# Patient Record
Sex: Female | Born: 1990 | Hispanic: Yes | Smoking: Never smoker
Health system: Southern US, Community
[De-identification: ages and names within clinical notes are randomized; demographics above are authoritative.]

## PROBLEM LIST (undated history)

## (undated) DIAGNOSIS — O039 Complete or unspecified spontaneous abortion without complication: Secondary | ICD-10-CM

## (undated) DIAGNOSIS — L309 Dermatitis, unspecified: Secondary | ICD-10-CM

## (undated) HISTORY — DX: Dermatitis, unspecified: L30.9

## (undated) HISTORY — DX: Complete or unspecified spontaneous abortion without complication: O03.9

## (undated) HISTORY — PX: WISDOM TOOTH EXTRACTION: SHX21

---

## 2012-05-21 ENCOUNTER — Encounter: Payer: Self-pay | Admitting: Family Medicine

## 2012-05-21 ENCOUNTER — Ambulatory Visit (INDEPENDENT_AMBULATORY_CARE_PROVIDER_SITE_OTHER): Payer: Self-pay | Admitting: Family Medicine

## 2012-05-21 VITALS — BP 117/75 | HR 80 | Temp 98.2°F | Wt 145.2 lb

## 2012-05-21 DIAGNOSIS — L309 Dermatitis, unspecified: Secondary | ICD-10-CM | POA: Insufficient documentation

## 2012-05-21 DIAGNOSIS — L259 Unspecified contact dermatitis, unspecified cause: Secondary | ICD-10-CM

## 2012-05-21 MED ORDER — TRIAMCINOLONE ACETONIDE 0.05 % EX OINT: TOPICAL_OINTMENT | CUTANEOUS | Status: DC

## 2012-05-21 MED ORDER — PREDNISONE 20 MG PO TABS: 40.0000 mg | ORAL_TABLET | Freq: Every day | ORAL | Status: DC

## 2012-05-21 NOTE — Progress Notes (Signed)
Patient ID: Meredith Hall, female   DOB: 1990-12-24, 22 y.o.   MRN: [id number] SUBJECTIVE: HPI: Rash Patient presents for evaluation of a rash involving the ear, face, forearm and head. Rash started 1 week ago. Lesions are pink, and raised in texture. Rash has not changed over time. Rash is pruritic. Associated symptoms: none. Patient denies: abdominal pain, arthralgia, congestion, cough, crankiness, decrease in appetite, decrease in energy level, fever, headache, irritability, myalgia, nausea, sore throat and vomiting. Patient has not had contacts with similar rash. Patient has not had new exposures (soaps, lotions, laundry detergents, foods, medications, plants, insects or animals). Patient has had eczema for years. Comes and goes once a year flare up . No triggers.   PMH/PSH: reviewed/updated in Epic  SH/FH: reviewed/updated in Epic  Allergies: reviewed/updated in Epic  Medications: reviewed/updated in Epic  Immunizations: reviewed/updated in Epic  ROS: As above in the HPI. All other systems are stable or negative.  OBJECTIVE: APPEARANCE:  Patient in no acute distress.The patient appeared well nourished and normally developed. Acyanotic. Waist: VITAL SIGNS:BP 117/75  Pulse 80  Temp(Src) 98.2 F (36.8 C) (Oral)  Wt 145 lb 3.2 oz (65.862 kg)  LMP 05/21/2012   SKIN: warm and  Dry without tattoos and scars.  Rash is red raised in various shapes, plaque like behind ears on upper eyelids and eye brows, neck, antecubital fossa.and forearm.  HEAD and Neck: without JVD, Head and scalp: normal Eyes:No scleral icterus. Fundi normal, eye movements normal. Ears: Auricle normal, canal normal, Tympanic membranes normal, insufflation normal. Nose: normal Throat: normal Neck & thyroid: normal  CHEST & LUNGS: Chest wall: normal Lungs: Clear  CVS: Reveals the PMI to be normally located. Regular rhythm, First and Second Heart sounds are normal,  absence of murmurs, rubs or  gallops.  MUSCULOSKELETAL:  Spine: normal Joints: intact  NEUROLOGIC: oriented to time,place and person; nonfocal.  ASSESSMENT: Eczema - Plan: predniSONE (DELTASONE) 20 MG tablet, TRIAMCINOLONE ACETONIDE, TOP, 0.05 % OINT   PLAN: Skin care. Avoid contact with Rx steroids on the face and eyes.. May use limited OTC HC for a few days. Use Johnsons baby shampoo to scalp, avoid the scented shampoos. RTC prn.  Sherae Santino P. [place] Charon, M.D.

## 2012-05-22 ENCOUNTER — Other Ambulatory Visit: Payer: Self-pay | Admitting: *Deleted

## 2012-05-22 MED ORDER — TRIAMCINOLONE ACETONIDE 0.1 % EX OINT: 1.0000 "application " | TOPICAL_OINTMENT | Freq: Two times a day (BID) | CUTANEOUS | Status: DC

## 2012-08-21 ENCOUNTER — Encounter: Payer: Self-pay | Admitting: General Practice

## 2012-08-21 ENCOUNTER — Ambulatory Visit (INDEPENDENT_AMBULATORY_CARE_PROVIDER_SITE_OTHER): Payer: Self-pay | Admitting: General Practice

## 2012-08-21 VITALS — BP 129/70 | HR 81 | Temp 98.3°F | Ht 63.0 in | Wt 142.0 lb

## 2012-08-21 DIAGNOSIS — H01139 Eczematous dermatitis of unspecified eye, unspecified eyelid: Secondary | ICD-10-CM

## 2012-08-21 MED ORDER — DESONIDE 0.05 % EX CREA: TOPICAL_CREAM | Freq: Two times a day (BID) | CUTANEOUS | Status: DC

## 2012-08-21 NOTE — Progress Notes (Signed)
  Subjective:    Patient ID: Meredith Hall, female    DOB: 06-22-1990, 22 y.o.   MRN: [id number]  Rash This is a new problem. The current episode started in the past 7 days. The problem is unchanged. The affected locations include the lips and face (bilateral eye lids and upper lip). The rash is characterized by burning and dryness. She was exposed to nothing. Pertinent negatives include no anorexia, congestion, eye pain, facial edema, fatigue, fever, nail changes, rhinorrhea, shortness of breath or sore throat. Past treatments include topical steroids (used cortisone cream OTC for two days, then discontinued). The treatment provided no relief. There is no history of allergies or asthma.  She denies use of eye make up or lip stick or changes in skin care products. Denies known contact with allergens.     Review of Systems  Constitutional: Negative for fever, chills and fatigue.  HENT: Negative for congestion, sore throat, rhinorrhea and sinus pressure.   Eyes: Negative for pain.  Respiratory: Negative for chest tightness and shortness of breath.   Cardiovascular: Negative for chest pain and palpitations.  Gastrointestinal: Negative for anorexia.  Skin: Positive for rash. Negative for nail changes.       Dry rash to bilateral upper eyelids and upper lip  Neurological: Negative for dizziness, weakness and headaches.       Objective:   Physical Exam  Constitutional: She is oriented to person, place, and time. She appears well-developed and well-nourished.  Cardiovascular: Normal rate, regular rhythm and normal heart sounds.   Pulmonary/Chest: Effort normal and breath sounds normal.  Neurological: She is alert and oriented to person, place, and time.  Skin: Skin is warm and dry. Rash noted.  Dry, skin colored rash to entire bilateral upper eye lids and partial inner lower lips. Also, same rash noted to upper out lip bilaterally  Psychiatric: She has a normal mood and affect.          Assessment & Plan:  1. Eczematous dermatitis of eyelid, unspecified laterality - desonide (DESOWEN) 0.05 % cream; Apply topically 2 (two) times daily. Use on affected areas for 10 days  Dispense: 30 g; Refill: 0 -discussed proper use of medicaiton -discussed possible side effects -RTO if symptoms worsen or unresolved, will refer to dermatology -discussed proper hydration of skin -Patient verbalized understanding -Coralie Keens, FNP-C

## 2012-08-21 NOTE — Patient Instructions (Signed)

## 2012-10-15 ENCOUNTER — Ambulatory Visit (INDEPENDENT_AMBULATORY_CARE_PROVIDER_SITE_OTHER): Payer: Self-pay | Admitting: Family Medicine

## 2012-10-15 ENCOUNTER — Encounter: Payer: Self-pay | Admitting: Family Medicine

## 2012-10-15 ENCOUNTER — Ambulatory Visit (INDEPENDENT_AMBULATORY_CARE_PROVIDER_SITE_OTHER): Payer: Self-pay

## 2012-10-15 VITALS — BP 110/72 | HR 82 | Temp 97.6°F | Wt 140.0 lb

## 2012-10-15 DIAGNOSIS — M25561 Pain in right knee: Secondary | ICD-10-CM

## 2012-10-15 DIAGNOSIS — M25569 Pain in unspecified knee: Secondary | ICD-10-CM

## 2012-10-15 NOTE — Progress Notes (Signed)
Patient ID: Meredith Hall, female   DOB: [birth year], 22 y.o.   MRN: [id number] SUBJECTIVE: CC: Chief Complaint  Patient presents with  . Acute Visit    rt lower leg raised area no injury tender to touch     HPI: Small bump several weeks  Right midleg. A little sore if pressed. No recollection of injury. No fever. Feels fine otherwise  No past medical history on file. No past surgical history on file. History   Social History  . Marital Status: Single    Spouse Name: N/A    Number of Children: N/A  . Years of Education: N/A   Occupational History  . Not on file.   Social History Main Topics  . Smoking status: Never Smoker   . Smokeless tobacco: Not on file  . Alcohol Use: Not on file  . Drug Use: Not on file  . Sexual Activity: Not on file   Other Topics Concern  . Not on file   Social History Narrative  . No narrative on file   Family History  Problem Relation Age of Onset  . Thyroid disease Mother    Current Outpatient Prescriptions on File Prior to Visit  Medication Sig Dispense Refill  . desonide (DESOWEN) 0.05 % cream Apply topically 2 (two) times daily. Use on affected areas for 10 days  30 g  0  . triamcinolone ointment (KENALOG) 0.1 % Apply 1 application topically 2 (two) times daily.  80 g  1   No current facility-administered medications on file prior to visit.   No Known Allergies  There is no immunization history on file for this patient. Prior to Admission medications   Medication Sig Start Date End Date Taking? Authorizing Provider  desonide (DESOWEN) 0.05 % cream Apply topically 2 (two) times daily. Use on affected areas for 10 days 08/21/12  Yes Coralie Keens, FNP  triamcinolone ointment (KENALOG) 0.1 % Apply 1 application topically 2 (two) times daily. 05/22/12  Yes Mary-Margaret Daphine Deutscher, FNP      ROS: As above in the HPI. All other systems are stable or negative.  OBJECTIVE: APPEARANCE:  Patient in no acute distress.The patient  appeared well nourished and normally developed. Acyanotic. Waist: VITAL SIGNS:BP 110/72  Pulse 82  Temp(Src) 97.6 F (36.4 C) (Oral)  Wt 140 lb (63.504 kg)  BMI 24.81 kg/m2  LMP 10/15/2012   SKIN: warm and  Dry without overt rashes, tattoos and scars  EXTREMETIES: nonedematous.  MUSCULOSKELETAL:  Spine: normal Joints: intact Right tibia, mid-leg: small 6mm lump subcutaneous. Smooth. Mildly tender    NEUROLOGIC: oriented to time,place and person; nonfocal. Strength is normal Sensory is normal Reflexes are normal Cranial Nerves are normal.  ASSESSMENT: Pain in joint, lower leg, right - Plan: DG Tibia/Fibula Right   PLAN: Orders Placed This Encounter  Procedures  . DG Tibia/Fibula Right    Standing Status: Future     Number of Occurrences: 1     Standing Expiration Date: 12/15/2013    Order Specific Question:  Reason for Exam (SYMPTOM  OR DIAGNOSIS REQUIRED)    Answer:  pain    Order Specific Question:  Is the patient pregnant?    Answer:  No    Order Specific Question:  Preferred imaging location?    Answer:  Internal    [facility] reading (PRIMARY) by  Dr.Gwenn Teodoro: no acute findings.  Observe.  If increase in size will consider U/S. RTC prn  Pepper Kerrick P. [place] Charon, M.D.

## 2013-01-07 ENCOUNTER — Encounter: Payer: Self-pay | Admitting: Family Medicine

## 2013-01-07 ENCOUNTER — Ambulatory Visit (INDEPENDENT_AMBULATORY_CARE_PROVIDER_SITE_OTHER): Payer: Self-pay | Admitting: Family Medicine

## 2013-01-07 VITALS — BP 114/77 | HR 84 | Temp 98.8°F | Ht 64.0 in | Wt 141.0 lb

## 2013-01-07 DIAGNOSIS — J029 Acute pharyngitis, unspecified: Secondary | ICD-10-CM

## 2013-01-07 DIAGNOSIS — R05 Cough: Secondary | ICD-10-CM

## 2013-01-07 DIAGNOSIS — R059 Cough, unspecified: Secondary | ICD-10-CM

## 2013-01-07 LAB — POCT RAPID STREP A (OFFICE): Rapid Strep A Screen: NEGATIVE

## 2013-01-07 MED ORDER — GUAIFENESIN-CODEINE 100-10 MG/5ML PO SYRP: 5.0000 mL | ORAL_SOLUTION | Freq: Three times a day (TID) | ORAL | Status: DC | PRN

## 2013-01-07 NOTE — Patient Instructions (Signed)
Faringitis Viral  (Viral Pharyngitis)   La faringitis virales una infección viral que produce enrojecimiento, dolor e hinchazón (inflamación) en la garganta. No se disemina de una persona a otra (no es contagiosa).   CAUSAS  La causa es la inhalación de una gran cantidad de gérmenes llamados virus. Muchos virus diferentes pueden causar faringitis viral.  SÍNTOMAS  Los síntomas de faringitis viral son:  · Dolor de garganta  · Cansancio.  · Nariz tapada.  · Fiebre no muy elevada  · Congestión  · Tos  TRATAMIENTO  El tratamiento incluye reposo, beber muchos líquidos y el uso de medicamentos de venta libre (autorizados por el médico)  INSTRUCCIONES PARA EL CUIDADO EN EL HOGAR   · Debe ingerir gran cantidad de líquido para mantener la orina de tono claro o color amarillo pálido.  · Consuma alimentos blandos, fríos, como helados de crema, de agua o gelatina.  · Puede hacer gárgaras con agua tibia con sal (una cucharadita en 1 litro de agua).  · Después de los 7 años, pueden administrarse pastillas para la tos con seguridad.  · Solo tome medicamentos que se pueden comprar sin receta o recetados para el dolor, malestar o fiebre, como le indica el médico. No tome aspirina  Para no contagiar evite:  · El contacto boca a boca con otras personas.  · Compartir utensilios para comer o beber.  · Toser cerca de otras personas  SOLICITE ATENCIÓN MÉDICA SI:   · Mejora luego de algunos días pero luego empeora.  · Tiene fiebre o siente un dolor intenso que no puede ser controlado con los medicamentos.  · Hay otros cambios que lo preocupan.  Document Released: 10/12/2004 Document Revised: 03/27/2011  [employer]® Patient Information ©2014 [employer].

## 2013-01-07 NOTE — Progress Notes (Signed)
.Patient ID: Meredith Hall, female   DOB: 18-Sep-1990, 22 y.o.   MRN: [id number] SUBJECTIVE: CC: Chief Complaint  Patient presents with  . Acute Visit    sore throat cough    NWG:NFAO Throat Patient complains of sore throat. Associated symptoms include coryza, dry cough and sore throat. Onset of symptoms was 3 days ago, and have been gradually worsening since that time. She is drinking plenty of fluids. She has not had recent close exposure to someone with proven streptococcal pharyngitis. Lingering cough  No past medical history on file. No past surgical history on file. History   Social History  . Marital Status: Single    Spouse Name: N/A    Number of Children: N/A  . Years of Education: N/A   Occupational History  . Not on file.   Social History Main Topics  . Smoking status: Never Smoker   . Smokeless tobacco: Not on file  . Alcohol Use: Not on file  . Drug Use: Not on file  . Sexual Activity: Not on file   Other Topics Concern  . Not on file   Social History Narrative  . No narrative on file   Family History  Problem Relation Age of Onset  . Thyroid disease Mother    Current Outpatient Prescriptions on File Prior to Visit  Medication Sig Dispense Refill  . desonide (DESOWEN) 0.05 % cream Apply topically 2 (two) times daily. Use on affected areas for 10 days  30 g  0  . triamcinolone ointment (KENALOG) 0.1 % Apply 1 application topically 2 (two) times daily.  80 g  1   No current facility-administered medications on file prior to visit.   No Known Allergies  There is no immunization history on file for this patient. Prior to Admission medications   Medication Sig Start Date End Date Taking? Authorizing Provider  desonide (DESOWEN) 0.05 % cream Apply topically 2 (two) times daily. Use on affected areas for 10 days 08/21/12   Coralie Keens, FNP  triamcinolone ointment (KENALOG) 0.1 % Apply 1 application topically 2 (two) times daily. 05/22/12   Mary-Margaret  Daphine Deutscher, FNP     ROS: As above in the HPI. All other systems are stable or negative.  OBJECTIVE: APPEARANCE:  Patient in no acute distress.The patient appeared well nourished and normally developed. Acyanotic. Waist: VITAL SIGNS:BP 114/77  Pulse 84  Temp(Src) 98.8 F (37.1 C) (Oral)  Ht 5\' 4"  (1.626 m)  Wt 141 lb (63.957 kg)  BMI 24.19 kg/m2  hispanic F  SKIN: warm and  Dry without overt rashes, tattoos and scars  HEAD and Neck: without JVD, Head and scalp: normal Eyes:No scleral icterus. Fundi normal, eye movements normal. Ears: Auricle normal, canal normal, Tympanic membranes normal, insufflation normal. Nose: nasal congestion Throat: tonsillar pillars are red Neck & thyroid: normal  CHEST & LUNGS: Chest wall: normal Lungs: Clear  CVS: Reveals the PMI to be normally located. Regular rhythm, First and Second Heart sounds are normal,  absence of murmurs, rubs or gallops. Peripheral vasculature: Radial pulses: normal Dorsal pedis pulses: normal Posterior pulses: normal  ABDOMEN:  Appearance: normal Benign, no organomegaly, no masses, no Abdominal Aortic enlargement. No Guarding , no rebound. No Bruits. Bowel sounds: normal  RECTAL: N/A GU: N/A  EXTREMETIES: nonedematous.  MUSCULOSKELETAL:  Spine: normal Joints: intact  NEUROLOGIC: oriented to time,place and person; nonfocal. Strength is normal Sensory is normal Reflexes are normal Cranial Nerves are normal.  ASSESSMENT: Sore throat - Plan: Rapid Strep A,  guaiFENesin-codeine (ROBITUSSIN AC) 100-10 MG/5ML syrup  Cough - Plan: guaiFENesin-codeine (ROBITUSSIN AC) 100-10 MG/5ML syrup  PLAN:  Orders Placed This Encounter  Procedures  . Rapid Strep A   Meds ordered this encounter  Medications  . guaiFENesin-codeine (ROBITUSSIN AC) 100-10 MG/5ML syrup    Sig: Take 5 mLs by mouth 3 (three) times daily as needed for cough.    Dispense:  120 mL    Refill:  0  saline gargles and fluids.  There are  no discontinued medications. Return if symptoms worsen or fail to improve.  Navid Lenzen P. [place] Charon, M.D.

## 2013-01-14 MED ORDER — HYDROCODONE-HOMATROPINE 5-1.5 MG/5ML PO SYRP: 5.0000 mL | ORAL_SOLUTION | Freq: Three times a day (TID) | ORAL | Status: DC | PRN

## 2013-01-14 NOTE — Addendum Note (Signed)
Addended by: Ileana Ladd on: 01/14/2013 02:08 PM   Modules accepted: Orders

## 2013-01-14 NOTE — Progress Notes (Signed)
Patient ID: Meredith Hall, female   DOB: 1990-02-23, 22 y.o.   MRN: [id number] Hacky cough incessant and cannot sleep. Lungs : clear  A: URI Uncontrollable cough  Plan ordered stronger cough medication Hycodan ordered Buckwheat honey Cough drops.  Tyler Cubit P. [place] Charon, M.D.

## 2013-02-24 ENCOUNTER — Other Ambulatory Visit: Payer: Self-pay | Admitting: General Practice

## 2013-02-24 ENCOUNTER — Telehealth: Payer: Self-pay

## 2013-02-24 ENCOUNTER — Other Ambulatory Visit: Payer: Self-pay | Admitting: Family Medicine

## 2013-02-24 DIAGNOSIS — L309 Dermatitis, unspecified: Secondary | ICD-10-CM

## 2013-02-24 MED ORDER — DESONIDE 0.05 % EX CREA: TOPICAL_CREAM | Freq: Two times a day (BID) | CUTANEOUS | Status: DC

## 2013-02-24 NOTE — Telephone Encounter (Signed)
Call patient : Prescription refilled & sent to pharmacy in [place]. 

## 2013-02-24 NOTE — Telephone Encounter (Signed)
Wants cream for eczema called in  Says she does not the ointment  Send to [place]

## 2013-02-24 NOTE — Telephone Encounter (Signed)
Wants trimaicolone cream refilled

## 2013-02-26 ENCOUNTER — Other Ambulatory Visit: Payer: Self-pay | Admitting: Family Medicine

## 2013-02-26 MED ORDER — TRIAMCINOLONE ACETONIDE 0.1 % EX OINT: 1.0000 "application " | TOPICAL_OINTMENT | Freq: Two times a day (BID) | CUTANEOUS | Status: DC

## 2013-02-26 NOTE — Telephone Encounter (Signed)
Call patient : Prescription refilled & sent to pharmacy in [place]. 

## 2013-02-26 NOTE — Telephone Encounter (Signed)
Patient was told.

## 2013-02-27 ENCOUNTER — Other Ambulatory Visit: Payer: Self-pay | Admitting: Family Medicine

## 2013-02-27 MED ORDER — TRIAMCINOLONE ACETONIDE 0.1 % EX CREA: 1.0000 "application " | TOPICAL_CREAM | Freq: Two times a day (BID) | CUTANEOUS | Status: DC

## 2013-03-07 ENCOUNTER — Ambulatory Visit (INDEPENDENT_AMBULATORY_CARE_PROVIDER_SITE_OTHER): Payer: BC Managed Care – PPO | Admitting: Physician Assistant

## 2013-03-07 ENCOUNTER — Encounter: Payer: Self-pay | Admitting: Physician Assistant

## 2013-03-07 VITALS — BP 105/67 | HR 70 | Temp 98.9°F | Ht 64.0 in | Wt 138.0 lb

## 2013-03-07 DIAGNOSIS — R21 Rash and other nonspecific skin eruption: Secondary | ICD-10-CM

## 2013-03-07 NOTE — Patient Instructions (Signed)
Continue to use moisturizers. Dove soap. Stop using makeup

## 2013-03-07 NOTE — Progress Notes (Signed)
   Subjective:    Patient ID: Meredith Hall, female    DOB: Jul 15, 1990, 23 y.o.   MRN: [id number]  HPI 23 y/o hispanic female presents with c/o worsening rash on face. Associated burning, stinging, redness. Worse with heat and when applying foundation makeup.  No recent change in soap, detergents, makeup, medications. Similar rash on dorsal surface of bilateral hands.     Review of Systems  Eyes: Positive for redness (bilateral upper lids) and itching (periocular, bilateral). Negative for discharge.  Respiratory: Negative.  Negative for cough, choking and shortness of breath.   Skin: Positive for color change (redness) and rash. Negative for pallor and wound.         Objective:   Physical Exam  Vitals reviewed. Constitutional: She appears well-developed and well-nourished.  HENT:  Head: Atraumatic.  Eyes: Conjunctivae are normal. Right eye exhibits no discharge. Left eye exhibits no discharge.  Pulmonary/Chest: No respiratory distress.  Skin: Rash (face) noted. There is erythema (perioral, periorbital, nasal crest and bilateral cheek).  Patch plaque erythematous rash on face, primarily perioral, periocular, nasal and bilateral cheek areas. Mild lichenification on cheeks          Assessment & Plan:  1. Rash on face: Due to patient's history, I feel that this is most likely atopic or contact dermatitis. I have advised her to stop using makeup due to burning sensation on application. Emphasized the importance of moisturizing. Use mild dove soap. She is already using cetaphil and aveeno and I would like for her to continue to do so. For further  treatment, she will need a referral to dermatology.

## 2013-05-15 ENCOUNTER — Telehealth: Payer: Self-pay | Admitting: Family Medicine

## 2013-05-15 ENCOUNTER — Other Ambulatory Visit (INDEPENDENT_AMBULATORY_CARE_PROVIDER_SITE_OTHER): Payer: BC Managed Care – PPO

## 2013-05-15 DIAGNOSIS — T148XXA Other injury of unspecified body region, initial encounter: Secondary | ICD-10-CM

## 2013-05-15 NOTE — Addendum Note (Signed)
Addended by: Pollyann Kennedy F on: 05/15/2013 04:26 PM   Modules accepted: Orders

## 2013-05-15 NOTE — Progress Notes (Signed)
Pt came in for labs only 

## 2013-05-15 NOTE — Telephone Encounter (Signed)
Easy bruising. No other problems. Plan: CBC. Mazzie Brodrick P. Jacelyn Grip, M.D.

## 2013-05-16 LAB — CBC WITH DIFFERENTIAL
Basophils Absolute: 0.1 10*3/uL (ref 0.0–0.2)
Basos: 1 %
Eos: 2 %
Eosinophils Absolute: 0.1 10*3/uL (ref 0.0–0.4)
HCT: 40.3 % (ref 34.0–46.6)
Hemoglobin: 13.6 g/dL (ref 11.1–15.9)
Immature Grans (Abs): 0 10*3/uL (ref 0.0–0.1)
Immature Granulocytes: 0 %
Lymphocytes Absolute: 2 10*3/uL (ref 0.7–3.1)
Lymphs: 30 %
MCH: 28.8 pg (ref 26.6–33.0)
MCHC: 33.7 g/dL (ref 31.5–35.7)
MCV: 85 fL (ref 79–97)
Monocytes Absolute: 0.5 10*3/uL (ref 0.1–0.9)
Monocytes: 8 %
Neutrophils Absolute: 3.9 10*3/uL (ref 1.4–7.0)
Neutrophils Relative %: 59 %
Platelets: 242 10*3/uL (ref 150–379)
RBC: 4.72 x10E6/uL (ref 3.77–5.28)
RDW: 12.7 % (ref 12.3–15.4)
WBC: 6.7 10*3/uL (ref 3.4–10.8)

## 2013-07-10 ENCOUNTER — Other Ambulatory Visit: Payer: Self-pay | Admitting: Family Medicine

## 2013-07-10 DIAGNOSIS — Z0000 Encounter for general adult medical examination without abnormal findings: Secondary | ICD-10-CM

## 2013-07-11 LAB — LIPID PANEL
Chol/HDL Ratio: 3.1 ratio units (ref 0.0–4.4)
Cholesterol, Total: 126 mg/dL (ref 100–189)
HDL: 41 mg/dL (ref >39)
LDL Calculated: 77 mg/dL (ref 0–119)
Triglycerides: 40 mg/dL (ref 0–114)
VLDL Cholesterol Cal: 8 mg/dL (ref 5–40)

## 2013-07-14 ENCOUNTER — Ambulatory Visit: Payer: Self-pay | Admitting: Family Medicine

## 2013-07-14 ENCOUNTER — Other Ambulatory Visit (INDEPENDENT_AMBULATORY_CARE_PROVIDER_SITE_OTHER): Payer: BC Managed Care – PPO

## 2013-07-14 DIAGNOSIS — Z0000 Encounter for general adult medical examination without abnormal findings: Secondary | ICD-10-CM

## 2013-07-14 LAB — SPECIMEN STATUS REPORT

## 2013-07-14 LAB — POCT GLYCOSYLATED HEMOGLOBIN (HGB A1C): Hemoglobin A1C: 4.8

## 2013-07-15 ENCOUNTER — Ambulatory Visit (INDEPENDENT_AMBULATORY_CARE_PROVIDER_SITE_OTHER): Payer: BC Managed Care – PPO | Admitting: Family Medicine

## 2013-07-15 ENCOUNTER — Encounter: Payer: Self-pay | Admitting: Family Medicine

## 2013-07-15 VITALS — BP 108/71 | HR 63 | Temp 98.7°F | Ht 63.0 in | Wt 132.6 lb

## 2013-07-15 DIAGNOSIS — Z0000 Encounter for general adult medical examination without abnormal findings: Secondary | ICD-10-CM

## 2013-07-15 LAB — POCT CBC
Granulocyte percent: 66.2 %G (ref 37–80)
HCT, POC: 40.6 % (ref 37.7–47.9)
Hemoglobin: 13.4 g/dL (ref 12.2–16.2)
Lymph, poc: 2.1 (ref 0.6–3.4)
MCH, POC: 28 pg (ref 27–31.2)
MCHC: 33 g/dL (ref 31.8–35.4)
MCV: 84.9 fL (ref 80–97)
MPV: 8.9 fL (ref 0–99.8)
POC Granulocyte: 4.6 (ref 2–6.9)
POC LYMPH PERCENT: 30.7 %L (ref 10–50)
Platelet Count, POC: 192 10*3/uL (ref 142–424)
RBC: 4.8 M/uL (ref 4.04–5.48)
RDW, POC: 12.2 %
WBC: 6.9 10*3/uL (ref 4.6–10.2)

## 2013-07-15 NOTE — Progress Notes (Signed)
   Subjective:    Patient ID: Meredith Hall, female    DOB: 02-Jun-1990, 23 y.o.   MRN: [id number]  HPI This 23 y.o. female presents for evaluation of wellness physical.  She has had lipids and hgbaic drawn and resulted and they are normal.  She has not had her other labs drawn.  She denies any acute medical problems.  She has hx of eczema.  She sees her dentist.  She has not had pap smear. She does SBE's monthly.  She exercises routinely.  She denies any emotional or depression problems.   Review of Systems No chest pain, SOB, HA, dizziness, vision change, N/V, diarrhea, constipation, dysuria, urinary urgency or frequency, myalgias, arthralgias or rash.     Objective:   Physical Exam  Vital signs noted  Well developed well nourished female.  HEENT - Head atraumatic Normocephalic                Eyes - PERRLA, Conjuctiva - clear Sclera- Clear EOMI                Ears - EAC's Wnl TM's Wnl Gross Hearing WNL                Nose - Nares patent                 Throat - oropharanx wnl Respiratory - Lungs CTA bilateral Cardiac - RRR S1 and S2 without murmur GI - Abdomen soft Nontender and bowel sounds active x 4 Extremities - No edema. Neuro - Grossly intact.      Assessment & Plan:  Routine general medical examination at a health care facility - Plan: POCT CBC, CMP14+EGFR, Thyroid Panel With TSH, Vit D  25 hydroxy (rtn osteoporosis monitoring)  Discussed with patient to follow up with OBGYN for female exam  Lysbeth Penner FNP

## 2013-07-16 LAB — CMP14+EGFR
ALT: 12 IU/L (ref 0–32)
AST: 14 IU/L (ref 0–40)
Albumin/Globulin Ratio: 1.8 (ref 1.1–2.5)
Albumin: 4.5 g/dL (ref 3.5–5.5)
Alkaline Phosphatase: 62 IU/L (ref 39–117)
BUN/Creatinine Ratio: 18 (ref 8–20)
BUN: 13 mg/dL (ref 6–20)
CO2: 23 mmol/L (ref 18–29)
Calcium: 9.6 mg/dL (ref 8.7–10.2)
Chloride: 102 mmol/L (ref 97–108)
Creatinine, Ser: 0.71 mg/dL (ref 0.57–1.00)
GFR calc Af Amer: 139 mL/min/{1.73_m2} (ref >59)
GFR calc non Af Amer: 120 mL/min/{1.73_m2} (ref >59)
Globulin, Total: 2.5 g/dL (ref 1.5–4.5)
Glucose: 86 mg/dL (ref 65–99)
Potassium: 5.3 mmol/L — ABNORMAL HIGH (ref 3.5–5.2)
Sodium: 141 mmol/L (ref 134–144)
Total Bilirubin: 0.4 mg/dL (ref 0.0–1.2)
Total Protein: 7 g/dL (ref 6.0–8.5)

## 2013-07-16 LAB — VITAMIN D 25 HYDROXY (VIT D DEFICIENCY, FRACTURES): Vit D, 25-Hydroxy: 16 ng/mL — ABNORMAL LOW (ref 30.0–100.0)

## 2013-07-16 LAB — THYROID PANEL WITH TSH
Free Thyroxine Index: 2.4 (ref 1.2–4.9)
T3 Uptake Ratio: 27 % (ref 24–39)
T4, Total: 9 ug/dL (ref 4.5–12.0)
TSH: 1.91 u[IU]/mL (ref 0.450–4.500)

## 2013-07-21 ENCOUNTER — Other Ambulatory Visit: Payer: Self-pay | Admitting: Family Medicine

## 2013-07-21 MED ORDER — VITAMIN D (ERGOCALCIFEROL) 1.25 MG (50000 UNIT) PO CAPS: 50000.0000 [IU] | ORAL_CAPSULE | ORAL | Status: DC

## 2013-07-21 NOTE — Progress Notes (Signed)
Pt aware of labs, repeat potassium in about 2wks; to pickup Vit D OTC.

## 2013-08-07 ENCOUNTER — Encounter: Payer: Self-pay | Admitting: Family Medicine

## 2013-08-07 ENCOUNTER — Ambulatory Visit (INDEPENDENT_AMBULATORY_CARE_PROVIDER_SITE_OTHER): Payer: BC Managed Care – PPO | Admitting: Family Medicine

## 2013-08-07 VITALS — BP 120/70 | HR 78 | Temp 99.2°F | Resp 16 | Wt 132.6 lb

## 2013-08-07 DIAGNOSIS — N390 Urinary tract infection, site not specified: Secondary | ICD-10-CM

## 2013-08-07 LAB — POCT URINALYSIS DIPSTICK
Bilirubin, UA: NEGATIVE
Glucose, UA: NEGATIVE
Ketones, UA: NEGATIVE
Nitrite, UA: NEGATIVE
Spec Grav, UA: 1.01
Urobilinogen, UA: NEGATIVE
pH, UA: 5

## 2013-08-07 LAB — POCT UA - MICROSCOPIC ONLY
Casts, Ur, LPF, POC: NEGATIVE
Crystals, Ur, HPF, POC: NEGATIVE
Mucus, UA: NEGATIVE
Yeast, UA: NEGATIVE

## 2013-08-07 MED ORDER — CEFTRIAXONE SODIUM 1 G IJ SOLR
1.0000 g | INTRAMUSCULAR | Status: AC
  Administered 2013-08-07: 1 g via INTRAMUSCULAR

## 2013-08-07 MED ORDER — CIPROFLOXACIN HCL 500 MG PO TABS: 500.0000 mg | ORAL_TABLET | Freq: Two times a day (BID) | ORAL | Status: DC

## 2013-08-07 MED ORDER — PHENAZOPYRIDINE HCL 200 MG PO TABS: 200.0000 mg | ORAL_TABLET | Freq: Three times a day (TID) | ORAL | Status: DC | PRN

## 2013-08-07 NOTE — Progress Notes (Signed)
   Subjective:    Patient ID: Meredith Hall, female    DOB: 04/21/1990, 23 y.o.   MRN: [id number]  HPI This 23 y.o. female presents for evaluation of dysuria and urinary frequency.   Review of Systems C/o dysuria and urinary frequency   No chest pain, SOB, HA, dizziness, vision change, N/V, diarrhea, constipation,myalgias, arthralgias or rash.  Objective:   Physical Exam   Vital signs noted  Well developed well nourished female.  HEENT - Head atraumatic Normocephalic                Eyes - PERRLA, Conjuctiva - clear Sclera- Clear EOMI                Ears - EAC's Wnl TM's Wnl Gross Hearing WNL                Throat - oropharanx wnl Respiratory - Lungs CTA bilateral Cardiac - RRR S1 and S2 without murmur GI - Abdomen soft Nontender and bowel sounds active x 4 Results for orders placed in visit on 08/07/13  POCT URINALYSIS DIPSTICK      Result Value Ref Range   Color, UA gold     Clarity, UA clear     Glucose, UA neg     Bilirubin, UA neg     Ketones, UA neg     Spec Grav, UA 1.010     Blood, UA trace     pH, UA 5.0     Protein, UA trace     Urobilinogen, UA negative     Nitrite, UA neg     Leukocytes, UA large (3+)    POCT UA - MICROSCOPIC ONLY      Result Value Ref Range   WBC, Ur, HPF, POC 40-80     RBC, urine, microscopic 1-5     Bacteria, U Microscopic few     Mucus, UA neg     Epithelial cells, urine per micros few     Crystals, Ur, HPF, POC neg     Casts, Ur, LPF, POC neg     Yeast, UA neg        Assessment & Plan:  Urinary tract infection, site not specified - Plan: ciprofloxacin (CIPRO) 500 MG tablet, Urine culture, phenazopyridine (PYRIDIUM) 200 MG tablet, POCT urinalysis dipstick, POCT UA - Microscopic Only.  Rocephin 1 gram IM  Lysbeth Penner FNP

## 2013-08-08 LAB — URINE CULTURE

## 2013-08-11 DIAGNOSIS — N390 Urinary tract infection, site not specified: Secondary | ICD-10-CM

## 2013-09-02 ENCOUNTER — Ambulatory Visit (INDEPENDENT_AMBULATORY_CARE_PROVIDER_SITE_OTHER): Payer: BC Managed Care – PPO | Admitting: Family

## 2013-09-02 ENCOUNTER — Encounter: Payer: Self-pay | Admitting: Family

## 2013-09-02 VITALS — BP 113/78 | HR 87 | Temp 98.6°F | Ht 63.0 in | Wt 135.0 lb

## 2013-09-02 DIAGNOSIS — N390 Urinary tract infection, site not specified: Secondary | ICD-10-CM

## 2013-09-02 LAB — POCT URINALYSIS DIPSTICK
Bilirubin, UA: NEGATIVE
Glucose, UA: NEGATIVE
Ketones, UA: NEGATIVE
NITRITE UA: NEGATIVE
PH UA: 6
Spec Grav, UA: 1.02
UROBILINOGEN UA: NEGATIVE

## 2013-09-02 LAB — POCT UA - MICROSCOPIC ONLY
CRYSTALS, UR, HPF, POC: NEGATIVE
Casts, Ur, LPF, POC: NEGATIVE
Mucus, UA: NEGATIVE
YEAST UA: NEGATIVE

## 2013-09-02 MED ORDER — NITROFURANTOIN MONOHYD MACRO 100 MG PO CAPS: 100.0000 mg | ORAL_CAPSULE | Freq: Two times a day (BID) | ORAL | Status: DC

## 2013-09-02 NOTE — Progress Notes (Signed)
Subjective:    Patient ID: Meredith Hall, female    DOB: 11/05/1990, 23 y.o.   MRN: [id number]  Urinary Tract Infection  This is a new problem. The current episode started in the past 7 days ("Over weekend"). The problem occurs every urination. The problem has been gradually worsening. The quality of the pain is described as burning and shooting. The pain is at a severity of 5/10. There has been no fever. Associated symptoms include a discharge, flank pain, frequency, hesitancy and urgency. Pertinent negatives include no chills, hematuria, nausea or vomiting. She has tried nothing for the symptoms. The treatment provided no relief. Her past medical history is significant for recurrent UTIs. There is no history of kidney stones.      Review of Systems  Constitutional: Negative.  Negative for chills.  HENT: Negative.   Eyes: Negative.   Respiratory: Negative.  Negative for shortness of breath.   Cardiovascular: Negative.  Negative for palpitations.  Gastrointestinal: Negative.  Negative for nausea and vomiting.  Endocrine: Negative.   Genitourinary: Positive for hesitancy, urgency, frequency and flank pain. Negative for hematuria.  Musculoskeletal: Negative.   Neurological: Negative.  Negative for headaches.  Hematological: Negative.   Psychiatric/Behavioral: Negative.   All other systems reviewed and are negative.      Objective:   Physical Exam  Vitals reviewed. Constitutional: She is oriented to person, place, and time. She appears well-developed and well-nourished. No distress.  HENT:  Head: Normocephalic and atraumatic.  Right Ear: External ear normal.  Left Ear: External ear normal.  Nose: Nose normal.  Mouth/Throat: Oropharynx is clear and moist.  Eyes: Pupils are equal, round, and reactive to light.  Neck: Normal range of motion. Neck supple. No thyromegaly present.  Cardiovascular: Normal rate, regular rhythm, normal heart sounds and intact distal pulses.   No  murmur heard. Pulmonary/Chest: Effort normal and breath sounds normal. No respiratory distress. She has no wheezes.  Abdominal: Soft. Bowel sounds are normal. She exhibits no distension. There is no tenderness.  Musculoskeletal: Normal range of motion. She exhibits no edema and no tenderness.  Neurological: She is alert and oriented to person, place, and time. She has normal reflexes. No cranial nerve deficit.  Skin: Skin is warm and dry.  Psychiatric: She has a normal mood and affect. Her behavior is normal. Judgment and thought content normal.     Results for orders placed in visit on 09/02/13  POCT UA - MICROSCOPIC ONLY      Result Value Ref Range   WBC, Ur, HPF, POC 5-10     RBC, urine, microscopic 1-3     Bacteria, U Microscopic occ     Mucus, UA negative     Epithelial cells, urine per micros few     Crystals, Ur, HPF, POC negative     Casts, Ur, LPF, POC negative     Yeast, UA negative    POCT URINALYSIS DIPSTICK      Result Value Ref Range   Color, UA gold     Clarity, UA clear     Glucose, UA negative     Bilirubin, UA negative     Ketones, UA negative     Spec Grav, UA 1.020     Blood, UA trace     pH, UA 6.0     Protein, UA 1+     Urobilinogen, UA negative     Nitrite, UA negative     Leukocytes, UA small (1+)  BP 113/78  Pulse 87  Temp(Src) 98.6 F (37 C) (Oral)  Ht 5\' 3"  (1.6 m)  Wt 135 lb (61.236 kg)  BMI 23.92 kg/m2  LMP 05/19/2013     Assessment & Plan:  .1. Urinary tract infection, site not specified -Force fluids AZO over the counter X2 days RTO prn Culture pending - POCT UA - Microscopic Only - POCT urinalysis dipstick - nitrofurantoin, macrocrystal-monohydrate, (MACROBID) 100 MG capsule; Take 1 capsule (100 mg total) by mouth 2 (two) times daily.  Dispense: 10 capsule; Refill: 0  Evelina Dun, FNP

## 2013-09-02 NOTE — Patient Instructions (Signed)

## 2013-09-02 NOTE — Addendum Note (Signed)
Addended by: Evelina Dun A on: 09/02/2013 03:22 PM   Modules accepted: Orders

## 2013-09-04 LAB — URINE CULTURE

## 2013-09-08 ENCOUNTER — Other Ambulatory Visit: Payer: Self-pay | Admitting: Family Medicine

## 2013-09-08 ENCOUNTER — Other Ambulatory Visit (INDEPENDENT_AMBULATORY_CARE_PROVIDER_SITE_OTHER): Payer: BC Managed Care – PPO

## 2013-09-08 DIAGNOSIS — N390 Urinary tract infection, site not specified: Secondary | ICD-10-CM

## 2013-09-08 DIAGNOSIS — R300 Dysuria: Secondary | ICD-10-CM

## 2013-09-08 LAB — POCT URINALYSIS DIPSTICK
Bilirubin, UA: NEGATIVE
Blood, UA: NEGATIVE
GLUCOSE UA: NEGATIVE
Ketones, UA: NEGATIVE
Nitrite, UA: NEGATIVE
PH UA: 5
Protein, UA: NEGATIVE
SPEC GRAV UA: 1.025
Urobilinogen, UA: NEGATIVE

## 2013-09-08 LAB — POCT UA - MICROSCOPIC ONLY
BACTERIA, U MICROSCOPIC: NEGATIVE
CRYSTALS, UR, HPF, POC: NEGATIVE
Casts, Ur, LPF, POC: NEGATIVE
MUCUS UA: NEGATIVE
RBC, urine, microscopic: NEGATIVE
Yeast, UA: NEGATIVE

## 2013-09-08 NOTE — Progress Notes (Signed)
Pt came in for lab  only 

## 2013-10-11 ENCOUNTER — Other Ambulatory Visit: Payer: Self-pay | Admitting: *Deleted

## 2013-10-11 ENCOUNTER — Other Ambulatory Visit: Payer: Self-pay | Admitting: Family Medicine

## 2013-10-11 DIAGNOSIS — R319 Hematuria, unspecified: Principal | ICD-10-CM

## 2013-10-11 DIAGNOSIS — N390 Urinary tract infection, site not specified: Secondary | ICD-10-CM

## 2013-10-11 LAB — POCT URINALYSIS DIPSTICK
Bilirubin, UA: NEGATIVE
Glucose, UA: NEGATIVE
KETONES UA: NEGATIVE
Nitrite, UA: NEGATIVE
PH UA: 5
PROTEIN UA: NEGATIVE
Urobilinogen, UA: NEGATIVE

## 2013-10-11 MED ORDER — SULFAMETHOXAZOLE-TMP DS 800-160 MG PO TABS: 1.0000 | ORAL_TABLET | Freq: Two times a day (BID) | ORAL | Status: DC

## 2013-10-13 ENCOUNTER — Other Ambulatory Visit: Payer: BC Managed Care – PPO

## 2013-10-13 DIAGNOSIS — N390 Urinary tract infection, site not specified: Secondary | ICD-10-CM

## 2013-10-13 NOTE — Progress Notes (Signed)
Lab only 

## 2013-10-13 NOTE — Addendum Note (Signed)
Addended by: Earlene Plater on: 10/13/2013 10:53 AM   Modules accepted: Orders

## 2013-10-14 LAB — URINE CULTURE: ORGANISM ID, BACTERIA: NO GROWTH

## 2013-11-09 IMAGING — CR DG TIBIA/FIBULA 2V*R*
2 series · 2 of 2 positions shown · non-contrast
Comparison: None.

CLINICAL DATA: Pain

EXAM:
RIGHT TIBIA AND FIBULA - 2 VIEW

[view not recorded (1 of 2)]
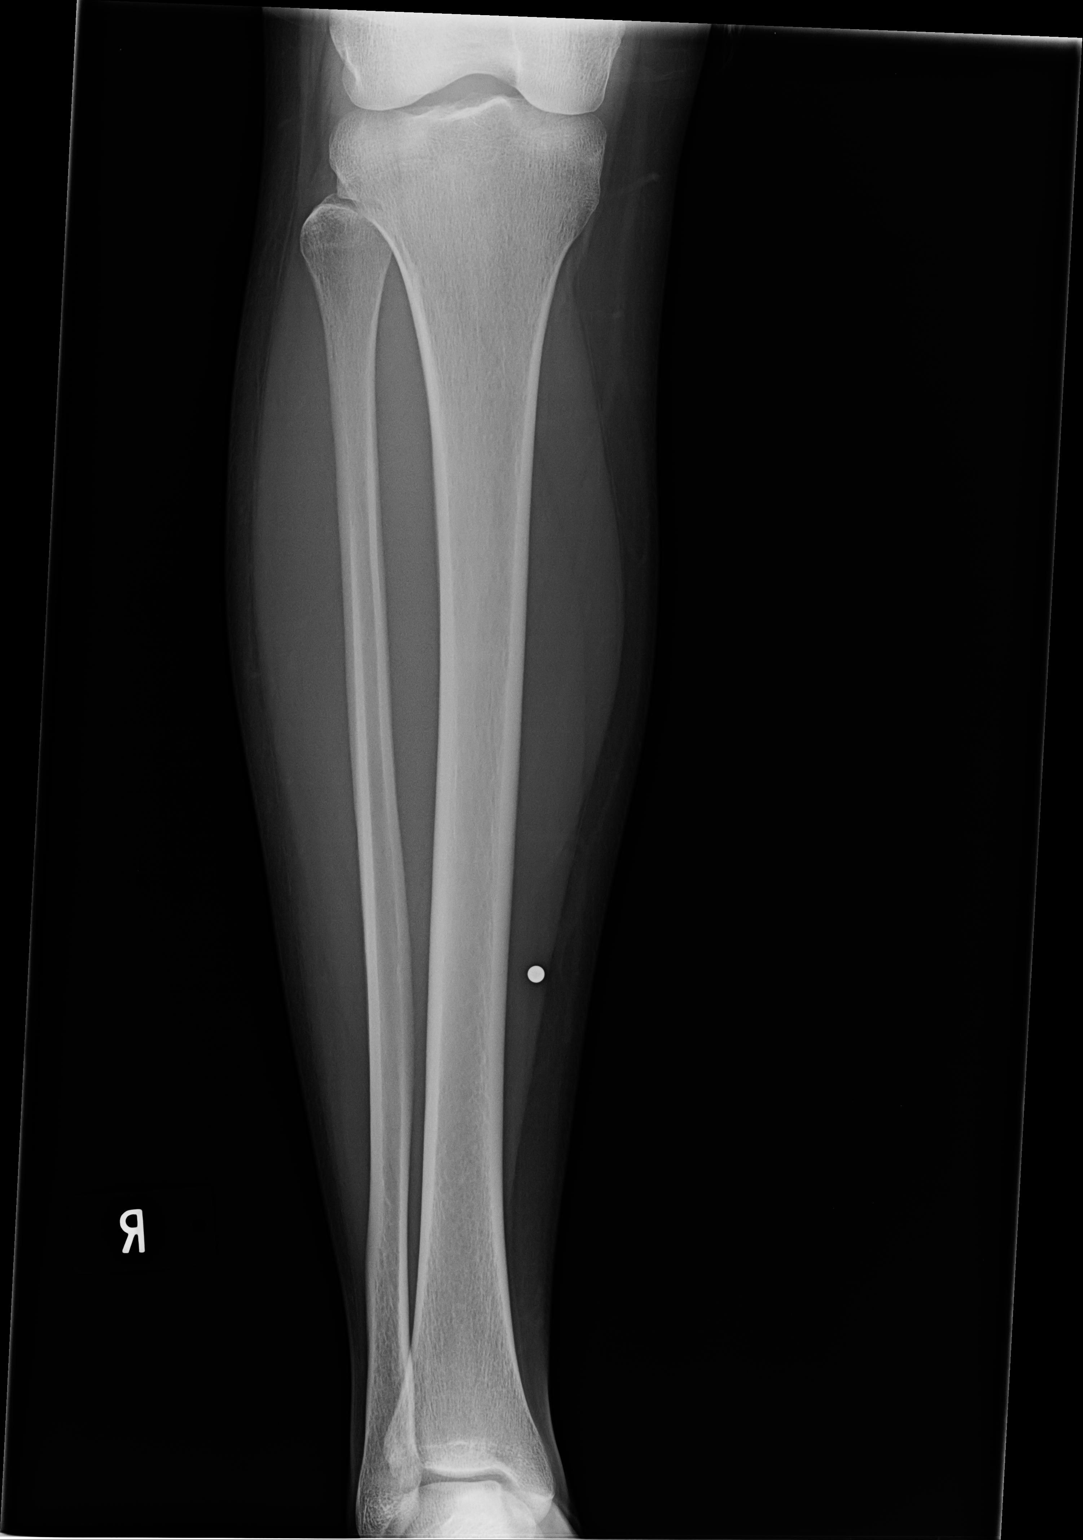

[view not recorded (2 of 2)]
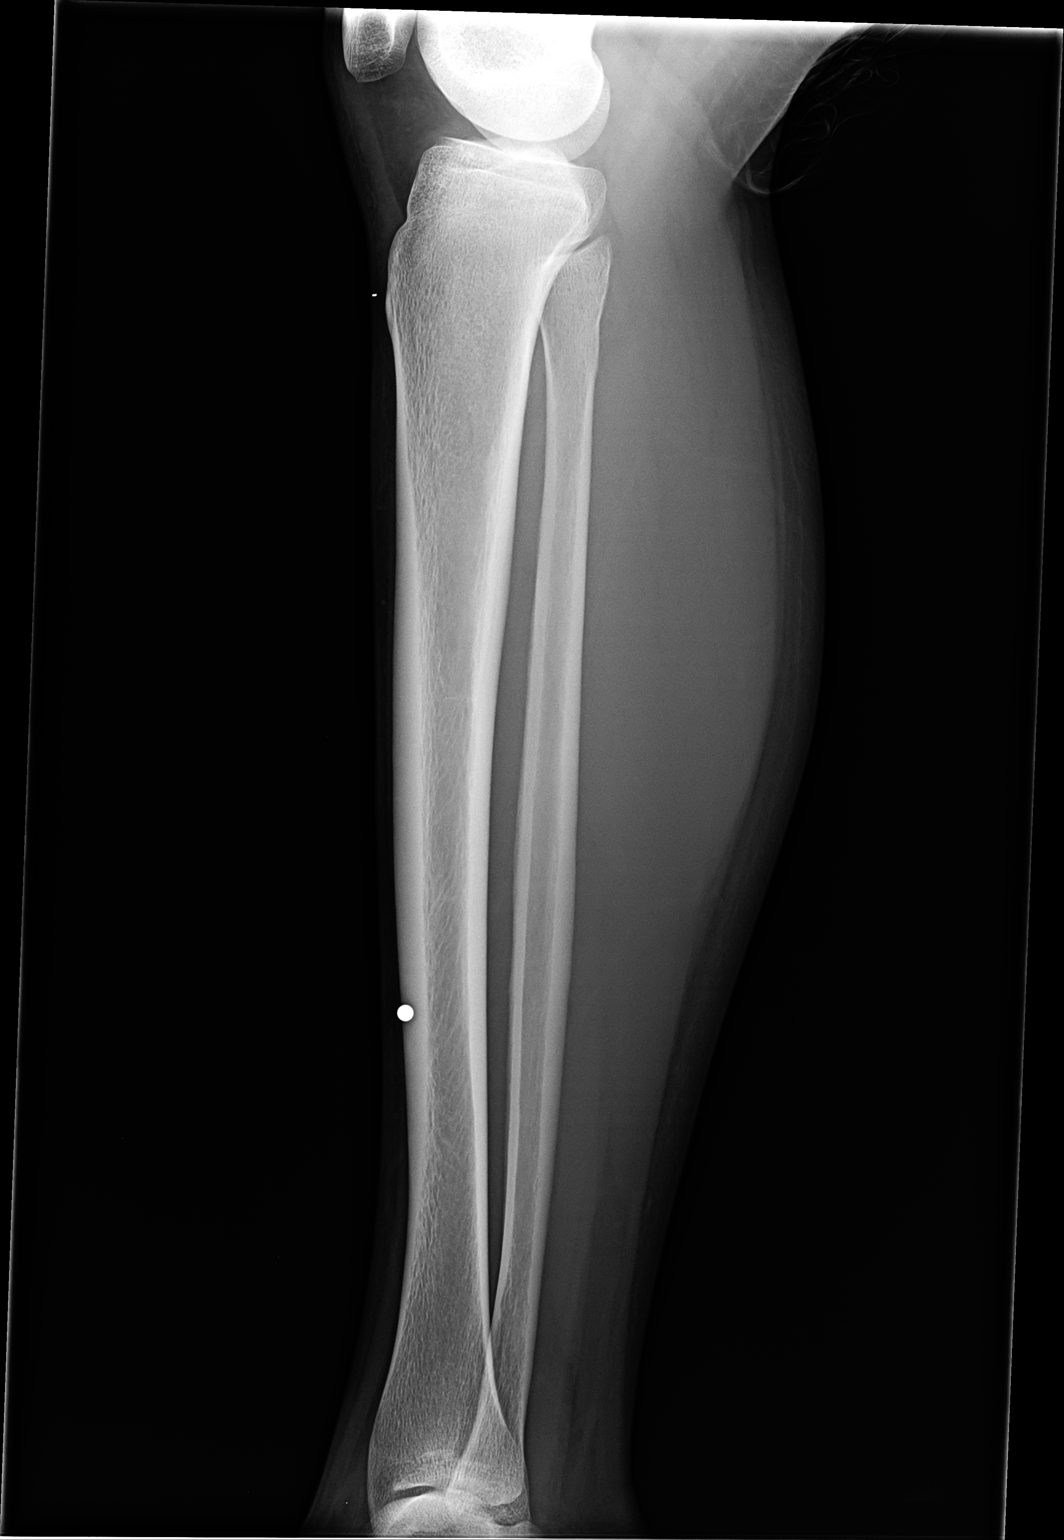

[2 of 2 positions shown; findings below may reference images not displayed]

FINDINGS: Frontal and lateral views were obtained. There is a small metallic
foreign body either in or overlying the junction of the mid and
distal thirds of the tibia from the medial aspect. No fracture or
dislocation. Joint spaces appear intact. There is no abnormal
periosteal reaction.
IMPRESSION: Metallic foreign body either in or overlying the junction of the mid
and distal thirds of the tibia medially. No bony abnormality.

## 2014-03-11 ENCOUNTER — Telehealth: Payer: Self-pay | Admitting: Family

## 2014-03-11 DIAGNOSIS — N390 Urinary tract infection, site not specified: Secondary | ICD-10-CM

## 2014-03-11 MED ORDER — NITROFURANTOIN MONOHYD MACRO 100 MG PO CAPS: 100.0000 mg | ORAL_CAPSULE | Freq: Two times a day (BID) | ORAL | Status: DC

## 2014-03-11 NOTE — Telephone Encounter (Signed)
Prescription sent to pharmacy.

## 2014-09-01 ENCOUNTER — Encounter: Payer: Self-pay | Admitting: Family

## 2014-09-01 ENCOUNTER — Ambulatory Visit (INDEPENDENT_AMBULATORY_CARE_PROVIDER_SITE_OTHER): Payer: 59 | Admitting: Family

## 2014-09-01 VITALS — BP 119/79 | HR 72 | Temp 97.7°F | Ht 63.0 in | Wt 147.0 lb

## 2014-09-01 DIAGNOSIS — N898 Other specified noninflammatory disorders of vagina: Secondary | ICD-10-CM

## 2014-09-01 DIAGNOSIS — B373 Candidiasis of vulva and vagina: Secondary | ICD-10-CM | POA: Diagnosis not present

## 2014-09-01 DIAGNOSIS — B3731 Acute candidiasis of vulva and vagina: Secondary | ICD-10-CM

## 2014-09-01 DIAGNOSIS — R309 Painful micturition, unspecified: Secondary | ICD-10-CM | POA: Diagnosis not present

## 2014-09-01 LAB — POCT UA - MICROSCOPIC ONLY
Bacteria, U Microscopic: NEGATIVE
Casts, Ur, LPF, POC: NEGATIVE
Crystals, Ur, HPF, POC: NEGATIVE
Mucus, UA: NEGATIVE
RBC, urine, microscopic: NEGATIVE
Yeast, UA: NEGATIVE

## 2014-09-01 LAB — POCT URINALYSIS DIPSTICK
Bilirubin, UA: NEGATIVE
Blood, UA: NEGATIVE
Glucose, UA: NEGATIVE
Ketones, UA: NEGATIVE
NITRITE UA: NEGATIVE
PROTEIN UA: NEGATIVE
Spec Grav, UA: 1.01
Urobilinogen, UA: NEGATIVE
pH, UA: 5

## 2014-09-01 LAB — POCT WET PREP WITH KOH: Trichomonas, UA: NEGATIVE

## 2014-09-01 MED ORDER — FLUCONAZOLE 150 MG PO TABS: 150.0000 mg | ORAL_TABLET | ORAL | Status: DC

## 2014-09-01 NOTE — Progress Notes (Signed)
   Subjective:    Patient ID: Meredith Hall, female    DOB: [birth year], 24 y.o.   MRN: [id number]  Dysuria  This is a new problem. The current episode started in the past 7 days. The problem occurs intermittently. The problem has been unchanged. The quality of the pain is described as burning. The pain is at a severity of 5/10. The pain is mild. Associated symptoms include chills, a discharge, flank pain, frequency and urgency. Pertinent negatives include no hematuria, hesitancy, nausea or vomiting. She has tried increased fluids for the symptoms. The treatment provided mild relief.  Vaginal Discharge The patient's primary symptoms include genital itching and vaginal discharge. This is a new problem. The current episode started in the past 7 days. The problem occurs constantly. The problem has been unchanged. The pain is mild. Associated symptoms include chills, dysuria, flank pain, frequency and urgency. Pertinent negatives include no headaches, hematuria, nausea or vomiting. The vaginal discharge was thick.      Review of Systems  Constitutional: Positive for chills.  HENT: Negative.   Eyes: Negative.   Respiratory: Negative.  Negative for shortness of breath.   Cardiovascular: Negative.  Negative for palpitations.  Gastrointestinal: Negative.  Negative for nausea and vomiting.  Endocrine: Negative.   Genitourinary: Positive for dysuria, urgency, frequency, flank pain and vaginal discharge. Negative for hesitancy and hematuria.  Musculoskeletal: Negative.   Neurological: Negative.  Negative for headaches.  Hematological: Negative.   Psychiatric/Behavioral: Negative.   All other systems reviewed and are negative.      Objective:   Physical Exam  Constitutional: She is oriented to person, place, and time. She appears well-developed and well-nourished. No distress.  HENT:  Head: Normocephalic and atraumatic.  Eyes: Pupils are equal, round, and reactive to light.  Neck: Normal  range of motion. Neck supple. No thyromegaly present.  Cardiovascular: Normal rate, regular rhythm, normal heart sounds and intact distal pulses.   No murmur heard. Pulmonary/Chest: Effort normal and breath sounds normal. No respiratory distress. She has no wheezes.  Abdominal: Soft. Bowel sounds are normal. She exhibits no distension. There is no tenderness.  Musculoskeletal: Normal range of motion. She exhibits no edema or tenderness.  Negative for CVA tenderness   Neurological: She is alert and oriented to person, place, and time. She has normal reflexes. No cranial nerve deficit.  Skin: Skin is warm and dry.  Psychiatric: She has a normal mood and affect. Her behavior is normal. Judgment and thought content normal.  Vitals reviewed.     BP 119/79 mmHg  Pulse 72  Temp(Src) 97.7 F (36.5 C) (Oral)  Ht 5\' 3"  (1.6 m)  Wt 147 lb (66.679 kg)  BMI 26.05 kg/m2     Assessment & Plan:  1. Painful urination - POCT UA - Microscopic Only - POCT urinalysis dipstick - POCT Wet Prep with KOH  2. Vaginal discharge - POCT Wet Prep with KOH  3. Vagina, candidiasis -Keep clean and dry -Cotton underwear -RTO prn - fluconazole (DIFLUCAN) 150 MG tablet; Take 1 tablet (150 mg total) by mouth every 3 (three) days.  Dispense: 2 tablet; Refill: 0  Evelina Dun, FNP

## 2014-09-01 NOTE — Patient Instructions (Signed)

## 2014-09-03 ENCOUNTER — Ambulatory Visit (INDEPENDENT_AMBULATORY_CARE_PROVIDER_SITE_OTHER): Payer: 59 | Admitting: Physician Assistant

## 2014-09-03 ENCOUNTER — Encounter: Payer: Self-pay | Admitting: Physician Assistant

## 2014-09-03 VITALS — BP 115/78 | HR 74 | Temp 97.9°F | Ht 63.0 in | Wt 147.0 lb

## 2014-09-03 DIAGNOSIS — L309 Dermatitis, unspecified: Secondary | ICD-10-CM | POA: Diagnosis not present

## 2014-09-03 DIAGNOSIS — Z0000 Encounter for general adult medical examination without abnormal findings: Secondary | ICD-10-CM

## 2014-09-03 DIAGNOSIS — R5383 Other fatigue: Secondary | ICD-10-CM | POA: Diagnosis not present

## 2014-09-03 DIAGNOSIS — Z833 Family history of diabetes mellitus: Secondary | ICD-10-CM | POA: Diagnosis not present

## 2014-09-03 LAB — POCT GLYCOSYLATED HEMOGLOBIN (HGB A1C): Hemoglobin A1C: 5.1

## 2014-09-03 MED ORDER — TRIAMCINOLONE ACETONIDE 0.1 % EX CREA: 1.0000 "application " | TOPICAL_CREAM | Freq: Two times a day (BID) | CUTANEOUS | Status: DC

## 2014-09-03 MED ORDER — DESONIDE 0.05 % EX CREA: TOPICAL_CREAM | Freq: Two times a day (BID) | CUTANEOUS | Status: DC

## 2014-09-03 NOTE — Patient Instructions (Signed)
Eczema Eczema, also called atopic dermatitis, is a skin disorder that causes inflammation of the skin. It causes a red rash and dry, scaly skin. The skin becomes very itchy. Eczema is generally worse during the cooler winter months and often improves with the warmth of summer. Eczema usually starts showing signs in infancy. Some children outgrow eczema, but it may last through adulthood.  CAUSES  The exact cause of eczema is not known, but it appears to run in families. People with eczema often have a family history of eczema, allergies, asthma, or hay fever. Eczema is not contagious. Flare-ups of the condition may be caused by:   Contact with something you are sensitive or allergic to.   Stress. SIGNS AND SYMPTOMS  Dry, scaly skin.   Red, itchy rash.   Itchiness. This may occur before the skin rash and may be very intense.  DIAGNOSIS  The diagnosis of eczema is usually made based on symptoms and medical history. TREATMENT  Eczema cannot be cured, but symptoms usually can be controlled with treatment and other strategies. A treatment plan might include:  Controlling the itching and scratching.   Use over-the-counter antihistamines as directed for itching. This is especially useful at night when the itching tends to be worse.   Use over-the-counter steroid creams as directed for itching.   Avoid scratching. Scratching makes the rash and itching worse. It may also result in a skin infection (impetigo) due to a break in the skin caused by scratching.   Keeping the skin well moisturized with creams every day. This will seal in moisture and help prevent dryness. Lotions that contain alcohol and water should be avoided because they can dry the skin.   Limiting exposure to things that you are sensitive or allergic to (allergens).   Recognizing situations that cause stress.   Developing a plan to manage stress.  HOME CARE INSTRUCTIONS   Only take over-the-counter or  prescription medicines as directed by your health care provider.   Do not use anything on the skin without checking with your health care provider.   Keep baths or showers short (5 minutes) in warm (not hot) water. Use mild cleansers for bathing. These should be unscented. You may add nonperfumed bath oil to the bath water. It is best to avoid soap and bubble bath.   Immediately after a bath or shower, when the skin is still damp, apply a moisturizing ointment to the entire body. This ointment should be a petroleum ointment. This will seal in moisture and help prevent dryness. The thicker the ointment, the better. These should be unscented.   Keep fingernails cut short. Children with eczema may need to wear soft gloves or mittens at night after applying an ointment.   Dress in clothes made of cotton or cotton blends. Dress lightly, because heat increases itching.   A child with eczema should stay away from anyone with fever blisters or cold sores. The virus that causes fever blisters (herpes simplex) can cause a serious skin infection in children with eczema. SEEK MEDICAL CARE IF:   Your itching interferes with sleep.   Your rash gets worse or is not better within 1 week after starting treatment.   You see pus or soft yellow scabs in the rash area.   You have a fever.   You have a rash flare-up after contact with someone who has fever blisters.  Document Released: 12/31/1999 Document Revised: 10/23/2012 Document Reviewed: 08/05/2012 [employer] Patient Information 2015 [employer]. This information   is not intended to replace advice given to you by your health care provider. Make sure you discuss any questions you have with your health care provider.  

## 2014-09-03 NOTE — Progress Notes (Signed)
Subjective:    Patient ID: Meredith Hall, female    DOB: 22-Sep-1990, 24 y.o.   MRN: [id number]  HPI 24 y/o female presents for yearly physical with no pap. She has h/o eczema. Otherwise, she is healthy.   She has complaint today of increased fatigue. She has family h/o DM and thyroid problems.   She also has c/o increased dryness of her skin on flexural surface     Review of Systems  Constitutional: Positive for fatigue. Negative for unexpected weight change.  HENT: Negative.   Eyes: Negative.   Respiratory: Negative.   Cardiovascular: Negative.   Gastrointestinal: Negative.   Endocrine: Negative.   Genitourinary: Negative.   Musculoskeletal: Negative.   Skin: Positive for rash (on flexural surface of bilateral arms and back of neck ).       Objective:   Physical Exam  Constitutional: She is oriented to person, place, and time. She appears well-developed and well-nourished. No distress.  HENT:  Head: Normocephalic and atraumatic.  Right Ear: External ear normal.  Left Ear: External ear normal.  Eyes: Conjunctivae are normal. Pupils are equal, round, and reactive to light. Right eye exhibits no discharge. Left eye exhibits no discharge.  Neck: Normal range of motion. No tracheal deviation present. No thyromegaly present.  Cardiovascular: Normal rate, regular rhythm, normal heart sounds and intact distal pulses.  Exam reveals no gallop and no friction rub.   No murmur heard. Pulmonary/Chest: Effort normal and breath sounds normal. No respiratory distress. She has no wheezes. She has no rales. She exhibits no tenderness.  Abdominal: Soft. Bowel sounds are normal. She exhibits no distension and no mass. There is no tenderness. There is no rebound and no guarding.  Musculoskeletal: Normal range of motion. She exhibits no edema or tenderness.  Neurological: She is alert and oriented to person, place, and time.  Skin: She is not diaphoretic.  Scaling patches on posterior arms  and BLE   Psychiatric: She has a normal mood and affect. Her behavior is normal. Judgment and thought content normal.  Nursing note and vitals reviewed.         Assessment & Plan:  1. Periodic health assessment, general screening, adult  - POCT glycosylated hemoglobin (Hb A1C) - Lipid panel - TSH - CMP14+EGFR - CBC with Differential/Platelet  2. Eczema  - desonide (DESOWEN) 0.05 % cream; Apply topically 2 (two) times daily.  Dispense: 30 g; Refill: 5 - triamcinolone cream (KENALOG) 0.1 %; Apply 1 application topically 2 (two) times daily. Do not use on face  Dispense: 30 g; Refill: 5 - Aerobic culture - WILL TREAT IF INDICATED    Continue all meds Labs pending Health Maintenance reviewed Diet and exercise encouraged RTO 1 year   H[employer]ish Petties A. Benjamin Stain PA-C

## 2014-09-04 LAB — CBC WITH DIFFERENTIAL/PLATELET
Basophils Absolute: 0 10*3/uL (ref 0.0–0.2)
Basos: 0 %
EOS (ABSOLUTE): 0.2 10*3/uL (ref 0.0–0.4)
EOS: 3 %
HEMATOCRIT: 40.7 % (ref 34.0–46.6)
Hemoglobin: 14 g/dL (ref 11.1–15.9)
IMMATURE GRANULOCYTES: 0 %
Immature Grans (Abs): 0 10*3/uL (ref 0.0–0.1)
LYMPHS ABS: 1.7 10*3/uL (ref 0.7–3.1)
Lymphs: 25 %
MCH: 28.7 pg (ref 26.6–33.0)
MCHC: 34.4 g/dL (ref 31.5–35.7)
MCV: 84 fL (ref 79–97)
Monocytes Absolute: 0.6 10*3/uL (ref 0.1–0.9)
Monocytes: 9 %
NEUTROS PCT: 63 %
Neutrophils Absolute: 4.3 10*3/uL (ref 1.4–7.0)
Platelets: 227 10*3/uL (ref 150–379)
RBC: 4.87 x10E6/uL (ref 3.77–5.28)
RDW: 13 % (ref 12.3–15.4)
WBC: 6.9 10*3/uL (ref 3.4–10.8)

## 2014-09-04 LAB — LIPID PANEL
CHOL/HDL RATIO: 3.1 ratio (ref 0.0–4.4)
CHOLESTEROL TOTAL: 121 mg/dL (ref 100–199)
HDL: 39 mg/dL — AB (ref >39)
LDL CALC: 73 mg/dL (ref 0–99)
TRIGLYCERIDES: 46 mg/dL (ref 0–149)
VLDL CHOLESTEROL CAL: 9 mg/dL (ref 5–40)

## 2014-09-04 LAB — CMP14+EGFR
A/G RATIO: 2 (ref 1.1–2.5)
ALBUMIN: 4.7 g/dL (ref 3.5–5.5)
ALT: 13 IU/L (ref 0–32)
AST: 13 IU/L (ref 0–40)
Alkaline Phosphatase: 69 IU/L (ref 39–117)
BILIRUBIN TOTAL: 0.5 mg/dL (ref 0.0–1.2)
BUN/Creatinine Ratio: 16 (ref 8–20)
BUN: 11 mg/dL (ref 6–20)
CHLORIDE: 103 mmol/L (ref 97–108)
CO2: 22 mmol/L (ref 18–29)
Calcium: 9.4 mg/dL (ref 8.7–10.2)
Creatinine, Ser: 0.67 mg/dL (ref 0.57–1.00)
GFR calc Af Amer: 142 mL/min/{1.73_m2} (ref >59)
GFR calc non Af Amer: 123 mL/min/{1.73_m2} (ref >59)
Globulin, Total: 2.4 g/dL (ref 1.5–4.5)
Glucose: 92 mg/dL (ref 65–99)
POTASSIUM: 4.2 mmol/L (ref 3.5–5.2)
Sodium: 141 mmol/L (ref 134–144)
Total Protein: 7.1 g/dL (ref 6.0–8.5)

## 2014-09-04 LAB — TSH: TSH: 2.58 u[IU]/mL (ref 0.450–4.500)

## 2014-09-05 LAB — AEROBIC CULTURE

## 2014-09-23 ENCOUNTER — Other Ambulatory Visit (INDEPENDENT_AMBULATORY_CARE_PROVIDER_SITE_OTHER): Payer: 59 | Admitting: *Deleted

## 2014-09-23 DIAGNOSIS — N898 Other specified noninflammatory disorders of vagina: Secondary | ICD-10-CM | POA: Diagnosis not present

## 2014-09-23 DIAGNOSIS — R300 Dysuria: Secondary | ICD-10-CM

## 2014-09-24 ENCOUNTER — Other Ambulatory Visit: Payer: 59

## 2014-09-24 ENCOUNTER — Other Ambulatory Visit: Payer: Self-pay | Admitting: Physician Assistant

## 2014-09-24 DIAGNOSIS — N390 Urinary tract infection, site not specified: Secondary | ICD-10-CM

## 2014-09-24 LAB — POCT WET PREP (WET MOUNT)

## 2014-09-24 LAB — POCT UA - MICROSCOPIC ONLY
CASTS, UR, LPF, POC: NEGATIVE
CRYSTALS, UR, HPF, POC: NEGATIVE
MUCUS UA: NEGATIVE
YEAST UA: NEGATIVE

## 2014-09-24 LAB — POCT URINALYSIS DIPSTICK
BILIRUBIN UA: NEGATIVE
Blood, UA: NEGATIVE
Glucose, UA: NEGATIVE
KETONES UA: NEGATIVE
Nitrite, UA: NEGATIVE
PH UA: 7
PROTEIN UA: NEGATIVE
Spec Grav, UA: 1.01
Urobilinogen, UA: NEGATIVE

## 2014-09-24 MED ORDER — FLUCONAZOLE 150 MG PO TABS: ORAL_TABLET | ORAL | Status: DC

## 2014-09-24 MED ORDER — NITROFURANTOIN MONOHYD MACRO 100 MG PO CAPS: 100.0000 mg | ORAL_CAPSULE | Freq: Two times a day (BID) | ORAL | Status: DC

## 2014-10-28 ENCOUNTER — Ambulatory Visit (INDEPENDENT_AMBULATORY_CARE_PROVIDER_SITE_OTHER): Payer: 59 | Admitting: Pediatrics

## 2014-10-28 ENCOUNTER — Encounter: Payer: Self-pay | Admitting: Pediatrics

## 2014-10-28 VITALS — BP 122/79 | HR 81 | Temp 96.7°F | Ht 63.0 in | Wt 145.0 lb

## 2014-10-28 DIAGNOSIS — K529 Noninfective gastroenteritis and colitis, unspecified: Secondary | ICD-10-CM | POA: Diagnosis not present

## 2014-10-28 NOTE — Progress Notes (Signed)
    Subjective:    Patient ID: Meredith Hall, female    DOB: [birth year], 24 y.o.   MRN: [id number]  CC: stomach pain  HPI: Meredith Hall is a 24 y.o. female presenting on 10/28/2014 for Abdominal Pain  Yesterday 2am woke up vomiting. One more emesis during day, was what she had just eaten, non-bloody, nonbilious. Still feeling a little off, feels bloated, having some stomach pain. No diarrhea. Several other family members with vomiting, no diarrhea. Recent get-together, was a child there with vomiting. Has not taken anything for the pain. Does not feel nauseous today. No headaches/fevers. No URI symptoms.   Relevant past medical, surgical, family and social history reviewed and updated as indicated. Interim medical history since our last visit reviewed. Allergies and medications reviewed and updated.   ROS: Per HPI unless specifically indicated above  Past Medical History Patient Active Problem List   Diagnosis Date Noted  . Pain in joint, lower leg 10/15/2012  . Eczema 05/21/2012    Current Outpatient Prescriptions  Medication Sig Dispense Refill  . desonide (DESOWEN) 0.05 % cream Apply topically 2 (two) times daily. 30 g 5  . triamcinolone cream (KENALOG) 0.1 % Apply 1 application topically 2 (two) times daily. Do not use on face 30 g 5   No current facility-administered medications for this visit.       Objective:    BP 122/79 mmHg  Pulse 81  Temp(Src) 96.7 F (35.9 C) (Oral)  Ht 5\' 3"  (1.6 m)  Wt 145 lb (65.772 kg)  BMI 25.69 kg/m2  Wt Readings from Last 3 Encounters:  10/28/14 145 lb (65.772 kg)  09/03/14 147 lb (66.679 kg)  09/01/14 147 lb (66.679 kg)    Gen: NAD, alert, cooperative with exam, NCAT EYES: EOMI, no scleral injection or icterus ENT:  TMs pearly gray b/l, OP without erythema LYMPH: no cervical LAD CV: NRRR, normal S1/S2, no murmur, distal pulses 2+ b/l Resp: CTABL, no wheezes, normal WOB Abd: +BS, soft, NTND. no guarding or  organomegaly. Neg murphys, neg psoas/obturator.  Ext: No edema, warm Neuro: Alert and oriented     Assessment & Plan:   Meredith Hall was seen today for abdominal pain, given multiple other family members affected after a gathering with someone sick there, ddx includes viral vs food poisoning. She is feeling better today than yesterday with a benign exam. No fevers. Encouraged lots of fluids, will let me know if things getting worse again.  Diagnoses and all orders for this visit:  Gastroenteritis    Follow up plan: Return if symptoms worsen or fail to improve.  Assunta Found, MD [facility] 10/28/2014, 9:31 AM

## 2014-12-07 ENCOUNTER — Encounter: Payer: Self-pay | Admitting: Family

## 2014-12-07 ENCOUNTER — Ambulatory Visit (INDEPENDENT_AMBULATORY_CARE_PROVIDER_SITE_OTHER): Payer: 59 | Admitting: Family

## 2014-12-07 VITALS — BP 121/79 | HR 78 | Temp 97.6°F | Ht 63.0 in | Wt 147.0 lb

## 2014-12-07 DIAGNOSIS — S01331A Puncture wound without foreign body of right ear, initial encounter: Secondary | ICD-10-CM | POA: Diagnosis not present

## 2014-12-07 MED ORDER — MUPIROCIN 2 % EX OINT: 1.0000 "application " | TOPICAL_OINTMENT | Freq: Two times a day (BID) | CUTANEOUS | Status: DC

## 2014-12-07 NOTE — Patient Instructions (Signed)
Wound Care °Taking care of your wound properly can help to prevent pain and infection. It can also help your wound to heal more quickly.  °HOW TO CARE FOR YOUR WOUND  °· Take or apply over-the-counter and prescription medicines only as told by your health care provider. °· If you were prescribed antibiotic medicine, take or apply it as told by your health care provider. Do not stop using the antibiotic even if your condition improves. °· Clean the wound each day or as told by your health care provider. °¨ Wash the wound with mild soap and water. °¨ Rinse the wound with water to remove all soap. °¨ Pat the wound dry with a clean towel. Do not rub it. °· There are many different ways to close and cover a wound. For example, a wound can be covered with stitches (sutures), skin glue, or adhesive strips. Follow instructions from your health care provider about: °¨ How to take care of your wound. °¨ When and how you should change your bandage (dressing). °¨ When you should remove your dressing. °¨ Removing whatever was used to close your wound. °· Check your wound every day for signs of infection. Watch for: °¨ Redness, swelling, or pain. °¨ Fluid, blood, or pus. °· Keep the dressing dry until your health care provider says it can be removed. Do not take baths, swim, use a hot tub, or do anything that would put your wound underwater until your health care provider approves. °· Raise (elevate) the injured area above the level of your heart while you are sitting or lying down. °· Do not scratch or pick at the wound. °· Keep all follow-up visits as told by your health care provider. This is important. °SEEK MEDICAL CARE IF: °· You received a tetanus shot and you have swelling, severe pain, redness, or bleeding at the injection site. °· You have a fever. °· Your pain is not controlled with medicine. °· You have increased redness, swelling, or pain at the site of your wound. °· You have fluid, blood, or pus coming from your  wound. °· You notice a bad smell coming from your wound or your dressing. °SEEK IMMEDIATE MEDICAL CARE IF: °· You have a red streak going away from your wound. °  °This information is not intended to replace advice given to you by your health care provider. Make sure you discuss any questions you have with your health care provider. °  °Document Released: 10/12/2007 Document Revised: 05/19/2014 Document Reviewed: 12/29/2013 °Elsevier Interactive Patient Education ©2016 [employer] ° °

## 2014-12-07 NOTE — Progress Notes (Signed)
   Subjective:    Patient ID: Meredith Hall, female    DOB: March 15, 1990, 24 y.o.   MRN: [id number]  HPI PT presents to the office today for right outer pain that started two days ago. Pt states she got her outer ear cartilage pierced  on 11/27/14. Pt states she is having constant pain of 4 out 10 pain. Pt reports cleaning it several times a day, but had a friend this weekend "touching it several times". Pt states the redness and tenderness are unchanged since Saturday. Pt denies any drainage.    Review of Systems  Constitutional: Negative.   HENT: Negative.   Eyes: Negative.   Respiratory: Negative.  Negative for shortness of breath.   Cardiovascular: Negative.  Negative for palpitations.  Gastrointestinal: Negative.   Endocrine: Negative.   Genitourinary: Negative.   Musculoskeletal: Negative.   Neurological: Negative.  Negative for headaches.  Hematological: Negative.   Psychiatric/Behavioral: Negative.   All other systems reviewed and are negative.      Objective:   Physical Exam  Constitutional: She is oriented to person, place, and time. She appears well-developed and well-nourished. No distress.  HENT:  Head: Normocephalic and atraumatic.  Right Ear: There is swelling and tenderness (mild erythemas ).  Left Ear: External ear normal.  Ears:  Mouth/Throat: Oropharynx is clear and moist.  Eyes: Pupils are equal, round, and reactive to light.  Neck: Normal range of motion. Neck supple. No thyromegaly present.  Cardiovascular: Normal rate, regular rhythm, normal heart sounds and intact distal pulses.   No murmur heard. Pulmonary/Chest: Effort normal and breath sounds normal. No respiratory distress. She has no wheezes.  Abdominal: Soft. Bowel sounds are normal. She exhibits no distension. There is no tenderness.  Musculoskeletal: Normal range of motion. She exhibits no edema or tenderness.  Neurological: She is alert and oriented to person, place, and time. She has normal  reflexes. No cranial nerve deficit.  Skin: Skin is warm and dry.  Psychiatric: She has a normal mood and affect. Her behavior is normal. Judgment and thought content normal.  Vitals reviewed.   BP 121/79 mmHg  Pulse 78  Temp(Src) 97.6 F (36.4 C) (Oral)  Ht 5\' 3"  (1.6 m)  Wt 147 lb (66.679 kg)  BMI 26.05 kg/m2       Assessment & Plan:  1. Complication of ear piercing, right, initial encounter -Keep clean and dry -Continue with cleaning solution BID -Do not pick or touch -Tylenol or Motrin prn for pain -RTO prn - mupirocin ointment (BACTROBAN) 2 %; Apply 1 application topically 2 (two) times daily.  Dispense: 22 g; Refill: 0  Evelina Dun, FNP

## 2015-01-12 ENCOUNTER — Ambulatory Visit (INDEPENDENT_AMBULATORY_CARE_PROVIDER_SITE_OTHER): Payer: 59 | Admitting: Family

## 2015-01-12 ENCOUNTER — Encounter: Payer: Self-pay | Admitting: Family

## 2015-01-12 VITALS — BP 118/79 | HR 70 | Temp 98.0°F | Ht 63.0 in | Wt 145.0 lb

## 2015-01-12 DIAGNOSIS — J309 Allergic rhinitis, unspecified: Secondary | ICD-10-CM

## 2015-01-12 MED ORDER — MOMETASONE FUROATE 50 MCG/ACT NA SUSP: 2.0000 | Freq: Every day | NASAL | Status: DC

## 2015-01-12 NOTE — Progress Notes (Signed)
   Subjective:    Patient ID: Meredith Hall, female    DOB: 1990-08-19, 24 y.o.   MRN: [id number]  Ear Fullness  There is pain in the right ear. This is a new problem. The current episode started yesterday. The problem occurs every few minutes. The problem has been waxing and waning. There has been no fever. The pain is at a severity of 4/10. The pain is mild. Pertinent negatives include no coughing, ear discharge, headaches, hearing loss, rhinorrhea, sore throat or vomiting. She has tried nothing for the symptoms. The treatment provided no relief.      Review of Systems  Constitutional: Negative.   HENT: Negative.  Negative for ear discharge, hearing loss, rhinorrhea and sore throat.   Eyes: Negative.   Respiratory: Negative.  Negative for cough and shortness of breath.   Cardiovascular: Negative.  Negative for palpitations.  Gastrointestinal: Negative.  Negative for vomiting.  Endocrine: Negative.   Genitourinary: Negative.   Musculoskeletal: Negative.   Neurological: Negative.  Negative for headaches.  Hematological: Negative.   Psychiatric/Behavioral: Negative.   All other systems reviewed and are negative.      Objective:   Physical Exam  Constitutional: She is oriented to person, place, and time. She appears well-developed and well-nourished. No distress.  HENT:  Head: Normocephalic and atraumatic.  Right Ear: A middle ear effusion is present.  Left Ear: A middle ear effusion is present.  Mouth/Throat: Oropharynx is clear and moist.  Nasal passage erythemas with moderate swelling    Eyes: Pupils are equal, round, and reactive to light.  Neck: Normal range of motion. Neck supple. No thyromegaly present.  Cardiovascular: Normal rate, regular rhythm, normal heart sounds and intact distal pulses.   No murmur heard. Pulmonary/Chest: Effort normal and breath sounds normal. No respiratory distress. She has no wheezes.  Abdominal: Soft. Bowel sounds are normal. She  exhibits no distension. There is no tenderness.  Musculoskeletal: Normal range of motion. She exhibits no edema or tenderness.  Neurological: She is alert and oriented to person, place, and time. She has normal reflexes. No cranial nerve deficit.  Skin: Skin is warm and dry.  Psychiatric: She has a normal mood and affect. Her behavior is normal. Judgment and thought content normal.  Vitals reviewed.     BP 118/79 mmHg  Pulse 70  Temp(Src) 98 F (36.7 C) (Oral)  Ht 5\' 3"  (1.6 m)  Wt 145 lb (65.772 kg)  BMI 25.69 kg/m2     Assessment & Plan:  1. Allergic rhinitis, unspecified allergic rhinitis type -Avoid allergens -Pt to start OTC zyrtec or Claritin -- Take meds as prescribed - Use a cool mist humidifier  -Use saline nose sprays frequently -Saline irrigations of the nose can be very helpful if done frequently.  * 4X daily for 1 week*  * Use of a nettie pot can be helpful with this. Follow directions with this -RTO prn - mometasone (NASONEX) 50 MCG/ACT nasal spray; Place 2 sprays into the nose daily.  Dispense: 17 g; Refill: [place], FNP

## 2015-01-12 NOTE — Patient Instructions (Signed)
Allergic Rhinitis Allergic rhinitis is when the mucous membranes in the nose respond to allergens. Allergens are particles in the air that cause your body to have an allergic reaction. This causes you to release allergic antibodies. Through a chain of events, these eventually cause you to release histamine into the blood stream. Although meant to protect the body, it is this release of histamine that causes your discomfort, such as frequent sneezing, congestion, and an itchy, runny nose.  CAUSES Seasonal allergic rhinitis (hay fever) is caused by pollen allergens that may come from grasses, trees, and weeds. Year-round allergic rhinitis (perennial allergic rhinitis) is caused by allergens such as house dust mites, pet dander, and mold spores. SYMPTOMS  Nasal stuffiness (congestion).  Itchy, runny nose with sneezing and tearing of the eyes. DIAGNOSIS Your health care provider can help you determine the allergen or allergens that trigger your symptoms. If you and your health care provider are unable to determine the allergen, skin or blood testing may be used. Your health care provider will diagnose your condition after taking your health history and performing a physical exam. Your health care provider may assess you for other related conditions, such as asthma, pink eye, or an ear infection. TREATMENT Allergic rhinitis does not have a cure, but it can be controlled by:  Medicines that block allergy symptoms. These may include allergy shots, nasal sprays, and oral antihistamines.  Avoiding the allergen. Hay fever may often be treated with antihistamines in pill or nasal spray forms. Antihistamines block the effects of histamine. There are over-the-counter medicines that may help with nasal congestion and swelling around the eyes. Check with your health care provider before taking or giving this medicine. If avoiding the allergen or the medicine prescribed do not work, there are many new medicines  your health care provider can prescribe. Stronger medicine may be used if initial measures are ineffective. Desensitizing injections can be used if medicine and avoidance does not work. Desensitization is when a patient is given ongoing shots until the body becomes less sensitive to the allergen. Make sure you follow up with your health care provider if problems continue. HOME CARE INSTRUCTIONS It is not possible to completely avoid allergens, but you can reduce your symptoms by taking steps to limit your exposure to them. It helps to know exactly what you are allergic to so that you can avoid your specific triggers. SEEK MEDICAL CARE IF:  You have a fever.  You develop a cough that does not stop easily (persistent).  You have shortness of breath.  You start wheezing.  Symptoms interfere with normal daily activities.   This information is not intended to replace advice given to you by your health care provider. Make sure you discuss any questions you have with your health care provider.   Document Released: 09/27/2000 Document Revised: 01/23/2014 Document Reviewed: 09/09/2012 Elsevier Interactive Patient Education 2016 [employer] Inc[employer]  

## 2015-03-25 ENCOUNTER — Encounter: Payer: Self-pay | Admitting: Family

## 2015-03-25 ENCOUNTER — Ambulatory Visit (INDEPENDENT_AMBULATORY_CARE_PROVIDER_SITE_OTHER): Payer: 59 | Admitting: Family

## 2015-03-25 VITALS — BP 131/81 | HR 92 | Temp 98.0°F | Ht 63.0 in | Wt 145.0 lb

## 2015-03-25 DIAGNOSIS — H60393 Other infective otitis externa, bilateral: Secondary | ICD-10-CM | POA: Diagnosis not present

## 2015-03-25 DIAGNOSIS — J309 Allergic rhinitis, unspecified: Secondary | ICD-10-CM

## 2015-03-25 MED ORDER — MOMETASONE FUROATE 50 MCG/ACT NA SUSP: 2.0000 | Freq: Every day | NASAL | Status: DC

## 2015-03-25 MED ORDER — CETIRIZINE HCL 10 MG PO TABS: 10.0000 mg | ORAL_TABLET | Freq: Every day | ORAL | Status: DC

## 2015-03-25 MED ORDER — OFLOXACIN 0.3 % OT SOLN: 5.0000 [drp] | Freq: Every day | OTIC | Status: DC

## 2015-03-25 NOTE — Patient Instructions (Signed)
Otitis Externa Otitis externa is a bacterial or fungal infection of the outer ear canal. This is the area from the eardrum to the outside of the ear. Otitis externa is sometimes called "swimmer's ear." CAUSES  Possible causes of infection include:  Swimming in dirty water.  Moisture remaining in the ear after swimming or bathing.  Mild injury (trauma) to the ear.  Objects stuck in the ear (foreign body).  Cuts or scrapes (abrasions) on the outside of the ear. SIGNS AND SYMPTOMS  The first symptom of infection is often itching in the ear canal. Later signs and symptoms may include swelling and redness of the ear canal, ear pain, and yellowish-white fluid (pus) coming from the ear. The ear pain may be worse when pulling on the earlobe. DIAGNOSIS  Your health care provider will perform a physical exam. A sample of fluid may be taken from the ear and examined for bacteria or fungi. TREATMENT  Antibiotic ear drops are often given for 10 to 14 days. Treatment may also include pain medicine or corticosteroids to reduce itching and swelling. HOME CARE INSTRUCTIONS   Apply antibiotic ear drops to the ear canal as prescribed by your health care provider.  Take medicines only as directed by your health care provider.  If you have diabetes, follow any additional treatment instructions from your health care provider.  Keep all follow-up visits as directed by your health care provider. PREVENTION   Keep your ear dry. Use the corner of a towel to absorb water out of the ear canal after swimming or bathing.  Avoid scratching or putting objects inside your ear. This can damage the ear canal or remove the protective wax that lines the canal. This makes it easier for bacteria and fungi to grow.  Avoid swimming in lakes, polluted water, or poorly chlorinated pools.  You may use ear drops made of rubbing alcohol and vinegar after swimming. Combine equal parts of white vinegar and alcohol in a bottle.  Put 3 or 4 drops into each ear after swimming. SEEK MEDICAL CARE IF:   You have a fever.  Your ear is still red, swollen, painful, or draining pus after 3 days.  Your redness, swelling, or pain gets worse.  You have a severe headache.  You have redness, swelling, pain, or tenderness in the area behind your ear. MAKE SURE YOU:   Understand these instructions.  Will watch your condition.  Will get help right away if you are not doing well or get worse.   This information is not intended to replace advice given to you by your health care provider. Make sure you discuss any questions you have with your health care provider.   Document Released: 01/02/2005 Document Revised: 01/23/2014 Document Reviewed: 01/19/2011 Elsevier Interactive Patient Education 2016 [employer]. Allergic Rhinitis Allergic rhinitis is when the mucous membranes in the nose respond to allergens. Allergens are particles in the air that cause your body to have an allergic reaction. This causes you to release allergic antibodies. Through a chain of events, these eventually cause you to release histamine into the blood stream. Although meant to protect the body, it is this release of histamine that causes your discomfort, such as frequent sneezing, congestion, and an itchy, runny nose.  CAUSES Seasonal allergic rhinitis (hay fever) is caused by pollen allergens that may come from grasses, trees, and weeds. Year-round allergic rhinitis (perennial allergic rhinitis) is caused by allergens such as house dust mites, pet dander, and mold spores. SYMPTOMS  Nasal stuffiness (congestion).  Itchy, runny nose with sneezing and tearing of the eyes. DIAGNOSIS Your health care provider can help you determine the allergen or allergens that trigger your symptoms. If you and your health care provider are unable to determine the allergen, skin or blood testing may be used. Your health care provider will diagnose your condition after  taking your health history and performing a physical exam. Your health care provider may assess you for other related conditions, such as asthma, pink eye, or an ear infection. TREATMENT Allergic rhinitis does not have a cure, but it can be controlled by:  Medicines that block allergy symptoms. These may include allergy shots, nasal sprays, and oral antihistamines.  Avoiding the allergen. Hay fever may often be treated with antihistamines in pill or nasal spray forms. Antihistamines block the effects of histamine. There are over-the-counter medicines that may help with nasal congestion and swelling around the eyes. Check with your health care provider before taking or giving this medicine. If avoiding the allergen or the medicine prescribed do not work, there are many new medicines your health care provider can prescribe. Stronger medicine may be used if initial measures are ineffective. Desensitizing injections can be used if medicine and avoidance does not work. Desensitization is when a patient is given ongoing shots until the body becomes less sensitive to the allergen. Make sure you follow up with your health care provider if problems continue. HOME CARE INSTRUCTIONS It is not possible to completely avoid allergens, but you can reduce your symptoms by taking steps to limit your exposure to them. It helps to know exactly what you are allergic to so that you can avoid your specific triggers. SEEK MEDICAL CARE IF:  You have a fever.  You develop a cough that does not stop easily (persistent).  You have shortness of breath.  You start wheezing.  Symptoms interfere with normal daily activities.   This information is not intended to replace advice given to you by your health care provider. Make sure you discuss any questions you have with your health care provider.   Document Released: 09/27/2000 Document Revised: 01/23/2014 Document Reviewed: 09/09/2012 Elsevier Interactive Patient  Education [employer].

## 2015-03-25 NOTE — Progress Notes (Signed)
   Subjective:    Patient ID: Meredith Hall, female    DOB: [birth year], 25 y.o.   MRN: [id number]  Otalgia  There is pain in both ears. This is a recurrent problem. The current episode started more than 1 month ago. The problem has been unchanged. There has been no fever. The pain is at a severity of 4/10. The pain is mild. Associated symptoms include headaches. Pertinent negatives include no coughing, ear discharge, hearing loss, neck pain, rhinorrhea or sore throat. Treatments tried: flonase. The treatment provided mild relief.      Review of Systems  Constitutional: Negative.   HENT: Positive for ear pain. Negative for ear discharge, hearing loss, rhinorrhea and sore throat.   Eyes: Negative.   Respiratory: Negative.  Negative for cough and shortness of breath.   Cardiovascular: Negative.  Negative for palpitations.  Gastrointestinal: Negative.   Endocrine: Negative.   Genitourinary: Negative.   Musculoskeletal: Negative.  Negative for neck pain.  Neurological: Positive for headaches.  Hematological: Negative.   Psychiatric/Behavioral: Negative.   All other systems reviewed and are negative.      Objective:   Physical Exam  Constitutional: She is oriented to person, place, and time. She appears well-developed and well-nourished. No distress.  HENT:  Head: Normocephalic and atraumatic.  Right Ear: A middle ear effusion is present.  Left Ear: A middle ear effusion is present.  Mouth/Throat: Oropharynx is clear and moist.  Nasal passage erythemas with moderate swelling Bilateral ear canal erythemas    Eyes: Pupils are equal, round, and reactive to light.  Neck: Normal range of motion. Neck supple. No thyromegaly present.  Cardiovascular: Normal rate, regular rhythm, normal heart sounds and intact distal pulses.   No murmur heard. Pulmonary/Chest: Effort normal and breath sounds normal. No respiratory distress. She has no wheezes.  Abdominal: Soft. Bowel sounds are  normal. She exhibits no distension. There is no tenderness.  Musculoskeletal: Normal range of motion. She exhibits no edema or tenderness.  Neurological: She is alert and oriented to person, place, and time. She has normal reflexes. No cranial nerve deficit.  Skin: Skin is warm and dry.  Psychiatric: She has a normal mood and affect. Her behavior is normal. Judgment and thought content normal.  Vitals reviewed.     BP 131/81 mmHg  Pulse 92  Temp(Src) 98 F (36.7 C) (Oral)  Ht 5\' 3"  (1.6 m)  Wt 145 lb (65.772 kg)  BMI 25.69 kg/m2     Assessment & Plan:  1. Allergic rhinitis, unspecified allergic rhinitis type -Pt to start daily zyrtec daily -Avoid allergens  - mometasone (NASONEX) 50 MCG/ACT nasal spray; Place 2 sprays into the nose daily.  Dispense: 17 g; Refill: 12 - cetirizine (ZYRTEC) 10 MG tablet; Take 1 tablet (10 mg total) by mouth daily.  Dispense: 30 tablet; Refill: 11  2. Otitis, externa, infective, bilateral -Keep clean and dry -Do not stick anything into ear - ofloxacin (FLOXIN OTIC) 0.3 % otic solution; Place 5 drops into both ears daily.  Dispense: 5 mL; Refill: 0  Evelina Dun, FNP

## 2015-04-21 ENCOUNTER — Encounter: Payer: Self-pay | Admitting: *Deleted

## 2015-04-21 ENCOUNTER — Encounter (INDEPENDENT_AMBULATORY_CARE_PROVIDER_SITE_OTHER): Payer: Self-pay

## 2015-05-20 ENCOUNTER — Encounter: Payer: Self-pay | Admitting: Family Medicine

## 2015-05-20 ENCOUNTER — Ambulatory Visit (INDEPENDENT_AMBULATORY_CARE_PROVIDER_SITE_OTHER): Payer: 59 | Admitting: Family Medicine

## 2015-05-20 DIAGNOSIS — M542 Cervicalgia: Secondary | ICD-10-CM | POA: Diagnosis not present

## 2015-05-20 DIAGNOSIS — R0789 Other chest pain: Secondary | ICD-10-CM | POA: Diagnosis not present

## 2015-05-20 MED ORDER — CYCLOBENZAPRINE HCL 10 MG PO TABS: 10.0000 mg | ORAL_TABLET | Freq: Three times a day (TID) | ORAL | Status: DC | PRN
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
## 2015-07-01 ENCOUNTER — Encounter: Payer: Self-pay | Admitting: Family

## 2015-07-01 ENCOUNTER — Ambulatory Visit (INDEPENDENT_AMBULATORY_CARE_PROVIDER_SITE_OTHER): Payer: 59 | Admitting: Family

## 2015-07-01 VITALS — BP 124/81 | HR 71 | Temp 97.4°F | Ht 63.0 in | Wt 154.4 lb

## 2015-07-01 DIAGNOSIS — R51 Headache: Secondary | ICD-10-CM

## 2015-07-01 DIAGNOSIS — J0100 Acute maxillary sinusitis, unspecified: Secondary | ICD-10-CM

## 2015-07-01 DIAGNOSIS — R519 Headache, unspecified: Secondary | ICD-10-CM

## 2015-07-01 MED ORDER — AMOXICILLIN-POT CLAVULANATE 875-125 MG PO TABS: 1.0000 | ORAL_TABLET | Freq: Two times a day (BID) | ORAL | Status: DC

## 2015-07-01 MED ORDER — IBUPROFEN 600 MG PO TABS: 600.0000 mg | ORAL_TABLET | Freq: Three times a day (TID) | ORAL | Status: DC | PRN

## 2015-07-01 NOTE — Progress Notes (Signed)
   Subjective:    Patient ID: Meredith Hall, female    DOB: 1990-09-28, 25 y.o.   MRN: [id number]  Sinus Problem This is a new problem. The current episode started 1 to 4 weeks ago. The problem has been waxing and waning since onset. Her pain is at a severity of 4/10. The pain is moderate. Associated symptoms include headaches, sinus pressure and sneezing. Pertinent negatives include no congestion, coughing, ear pain, hoarse voice, shortness of breath or sore throat. Past treatments include acetaminophen. The treatment provided mild relief.  Headache  Associated symptoms include sinus pressure. Pertinent negatives include no coughing, ear pain or sore throat.      Review of Systems  Constitutional: Negative.   HENT: Positive for sinus pressure and sneezing. Negative for congestion, ear pain, hoarse voice and sore throat.   Eyes: Negative.   Respiratory: Negative.  Negative for cough and shortness of breath.   Cardiovascular: Negative.  Negative for palpitations.  Gastrointestinal: Negative.   Endocrine: Negative.   Genitourinary: Negative.   Musculoskeletal: Negative.   Neurological: Positive for headaches.  Hematological: Negative.   Psychiatric/Behavioral: Negative.   All other systems reviewed and are negative.      Objective:   Physical Exam  Constitutional: She is oriented to person, place, and time. She appears well-developed and well-nourished. No distress.  HENT:  Head: Normocephalic and atraumatic.  Nose: Right sinus exhibits maxillary sinus tenderness. Left sinus exhibits maxillary sinus tenderness.  Eyes: Pupils are equal, round, and reactive to light.  Neck: Normal range of motion. Neck supple. No thyromegaly present.  Cardiovascular: Normal rate, regular rhythm, normal heart sounds and intact distal pulses.   No murmur heard. Pulmonary/Chest: Effort normal and breath sounds normal. No respiratory distress. She has no wheezes.  Abdominal: Soft. Bowel sounds  are normal. She exhibits no distension. There is no tenderness.  Musculoskeletal: Normal range of motion. She exhibits no edema or tenderness.  Neurological: She is alert and oriented to person, place, and time.  Skin: Skin is warm and dry.  Psychiatric: She has a normal mood and affect. Her behavior is normal. Judgment and thought content normal.  Vitals reviewed.     BP 124/81 mmHg  Pulse 71  Temp(Src) 97.4 F (36.3 C) (Oral)  Ht 5\' 3"  (1.6 m)  Wt 154 lb 6.4 oz (70.035 kg)  BMI 27.36 kg/m2     Assessment & Plan:  1. Sinus headache - ibuprofen (ADVIL,MOTRIN) 600 MG tablet; Take 1 tablet (600 mg total) by mouth every 8 (eight) hours as needed.  Dispense: 30 tablet; Refill: 0  2. Acute maxillary sinusitis, recurrence not specified -- Take meds as prescribed - Use a cool mist humidifier  -Use saline nose sprays frequently -Saline irrigations of the nose can be very helpful if done frequently.  * 4X daily for 1 week*  * Use of a nettie pot can be helpful with this. Follow directions with this* -Force fluids -For any cough or congestion  Use plain Mucinex- regular strength or max strength is fine   * Children- consult with Pharmacist for dosing -For fever or aces or pains- take tylenol or ibuprofen appropriate for age and weight.  * for fevers greater than 101 orally you may alternate ibuprofen and tylenol every  3 hours. -Throat lozenges if help - amoxicillin-clavulanate (AUGMENTIN) 875-125 MG tablet; Take 1 tablet by mouth 2 (two) times daily.  Dispense: 14 tablet; Refill: 0  Evelina Dun, FNP

## 2015-07-01 NOTE — Patient Instructions (Signed)
Sinus Headache A sinus headache occurs when the paranasal sinuses become clogged or swollen. Paranasal sinuses are air pockets within the bones of the face. Sinus headaches can range from mild to severe. CAUSES A sinus headache can result from various conditions that affect the sinuses, such as:  Colds.  Sinus infections.  Allergies. SYMPTOMS The main symptom of this condition is a headache that may feel like pain or pressure in the face, forehead, ears, or upper teeth. People who have a sinus headache often have other symptoms, such as:  Congested or runny nose.  Fever.  Inability to smell. Weather changes can make symptoms worse. DIAGNOSIS This condition may be diagnosed based on:  A physical exam and medical history.  Imaging tests, such as a CT scan and MRI, to check for problems with the sinuses.  A specialist may look into the sinuses with a tool that has a camera (endoscopy). TREATMENT Treatment for this condition depends on the cause.  Sinus pain that is caused by a sinus infection may be treated with antibiotic medicine.  Sinus pain that is caused by allergies may be helped by allergy medicines (antihistamines) and medicated nasal sprays.  Sinus pain that is caused by congestion may be helped by flushing the nose and sinuses with saline solution. HOME CARE INSTRUCTIONS  Take medicines only as directed by your health care provider.  If you were prescribed an antibiotic medicine, finish all of it even if you start to feel better.  If you have congestion, use a nasal spray to help reduce pressure.  If directed, apply a warm, moist washcloth to your face to help relieve pain. SEEK MEDICAL CARE IF:  You have headaches more than one time each week.  You have sensitivity to light or sound.  You have a fever.  You feel sick to your stomach (nauseous) or you throw up (vomit).  Your headaches do not get better with treatment. Many people think that they have a  sinus headache when they actually have migraines or tension headaches. SEEK IMMEDIATE MEDICAL CARE IF:  You have vision problems.  You have sudden, severe pain in your face or head.  You have a seizure.  You are confused.  You have a stiff neck.   This information is not intended to replace advice given to you by your health care provider. Make sure you discuss any questions you have with your health care provider.   Document Released: 02/10/2004 Document Revised: 05/19/2014 Document Reviewed: 12/29/2013 Elsevier Interactive Patient Education 2016 [employer] meds as prescribed - Use a cool mist humidifier  -Use saline nose sprays frequently -Saline irrigations of the nose can be very helpful if done frequently.  * 4X daily for 1 week*  * Use of a nettie pot can be helpful with this. Follow directions with this* -Force fluids -For any cough or congestion  Use plain Mucinex- regular strength or max strength is fine   * Children- consult with Pharmacist for dosing -For fever or aces or pains- take tylenol or ibuprofen appropriate for age and weight.  * for fevers greater than 101 orally you may alternate ibuprofen and tylenol every  3 hours. -Throat lozenges if help -New toothbrush in 3 days   Evelina Dun, FNP

## 2015-07-02 ENCOUNTER — Ambulatory Visit: Payer: Self-pay | Admitting: Family

## 2015-08-17 ENCOUNTER — Encounter: Payer: Self-pay | Admitting: Family

## 2015-08-17 ENCOUNTER — Ambulatory Visit (INDEPENDENT_AMBULATORY_CARE_PROVIDER_SITE_OTHER): Payer: 59 | Admitting: Family

## 2015-08-17 VITALS — BP 115/83 | HR 72 | Temp 98.4°F | Ht 63.0 in | Wt 157.2 lb

## 2015-08-17 DIAGNOSIS — N3001 Acute cystitis with hematuria: Secondary | ICD-10-CM

## 2015-08-17 DIAGNOSIS — J309 Allergic rhinitis, unspecified: Secondary | ICD-10-CM

## 2015-08-17 DIAGNOSIS — R109 Unspecified abdominal pain: Secondary | ICD-10-CM | POA: Diagnosis not present

## 2015-08-17 LAB — URINALYSIS, COMPLETE
Bilirubin, UA: NEGATIVE
GLUCOSE, UA: NEGATIVE
KETONES UA: NEGATIVE
Nitrite, UA: NEGATIVE
Protein, UA: NEGATIVE
SPEC GRAV UA: 1.02 (ref 1.005–1.030)
Urobilinogen, Ur: 1 mg/dL (ref 0.2–1.0)
pH, UA: 7 (ref 5.0–7.5)

## 2015-08-17 LAB — MICROSCOPIC EXAMINATION

## 2015-08-17 MED ORDER — SULFAMETHOXAZOLE-TRIMETHOPRIM 800-160 MG PO TABS: 1.0000 | ORAL_TABLET | Freq: Two times a day (BID) | ORAL | 0 refills | Status: DC

## 2015-08-17 NOTE — Patient Instructions (Signed)
Asymptomatic Bacteriuria, Female Asymptomatic bacteriuria is the presence of a large number of bacteria in your urine without the usual symptoms of burning or frequent urination. The following conditions increase the risk of asymptomatic bacteriuria:  Diabetes mellitus.  Advanced age.  Pregnancy in the first trimester.  Kidney stones.  Kidney transplants.  Leaky kidney tube valve in young children (reflux). Treatment for this condition is not needed in most people and can lead to other problems such as too much yeast and growth of resistant bacteria. However, some people, such as pregnant women, do need treatment to prevent kidney infection. Asymptomatic bacteriuria in pregnancy is also associated with fetal growth restriction, premature labor, and newborn death. HOME CARE INSTRUCTIONS Monitor your condition for any changes. The following actions may help to relieve any discomfort you are feeling:  Drink enough water and fluids to keep your urine clear or pale yellow. Go to the bathroom more often to keep your bladder empty.  Keep the area around your vagina and rectum clean. Wipe yourself from front to back after urinating. SEEK IMMEDIATE MEDICAL CARE IF:  You develop signs of an infection such as:  Burning with urination.  Frequency of voiding.  Back pain.  Fever.  You have blood in the urine.  You develop a fever. MAKE SURE YOU:  Understand these instructions.  Will watch your condition.  Will get help right away if you are not doing well or get worse.   This information is not intended to replace advice given to you by your health care provider. Make sure you discuss any questions you have with your health care provider.   Document Released: 01/02/2005 Document Revised: 01/23/2014 Document Reviewed: 06/24/2012 Elsevier Interactive Patient Education 2016 Elsevier Inc.  

## 2015-08-17 NOTE — Progress Notes (Signed)
   Subjective:    Patient ID: Meredith Hall, female    DOB: 08-May-1990, 25 y.o.   MRN: [id number]  Sinus Problem  This is a new problem. The current episode started in the past 7 days. The problem has been waxing and waning since onset. Her pain is at a severity of 2/10. The pain is mild. Associated symptoms include ear pain and sinus pressure. Pertinent negatives include no coughing, headaches or shortness of breath. Past treatments include lying down and saline sprays. The treatment provided mild relief.  Abdominal Pain  Associated symptoms include dysuria and frequency. Pertinent negatives include no headaches, hematuria, nausea or vomiting.  Dysuria   This is a new problem. The current episode started in the past 7 days. The problem occurs intermittently. The problem has been waxing and waning. The quality of the pain is described as burning. The pain is at a severity of 4/10. The pain is mild. Associated symptoms include frequency and urgency. Pertinent negatives include no flank pain, hematuria, nausea or vomiting. She has tried increased fluids for the symptoms. The treatment provided mild relief.      Review of Systems  Constitutional: Negative.   HENT: Positive for ear pain and sinus pressure.   Eyes: Negative.   Respiratory: Negative.  Negative for cough and shortness of breath.   Cardiovascular: Negative.  Negative for palpitations.  Gastrointestinal: Positive for abdominal pain. Negative for nausea and vomiting.  Endocrine: Negative.   Genitourinary: Positive for dysuria, frequency and urgency. Negative for flank pain and hematuria.  Musculoskeletal: Negative.   Neurological: Negative.  Negative for headaches.  Hematological: Negative.   Psychiatric/Behavioral: Negative.   All other systems reviewed and are negative.      Objective:   Physical Exam  Constitutional: She is oriented to person, place, and time. She appears well-developed and well-nourished. No distress.   HENT:  Head: Normocephalic and atraumatic.  Right Ear: External ear normal.  Left Ear: External ear normal.  Mouth/Throat: Oropharynx is clear and moist.  Nasal passage erythemas with mild swelling   Eyes: Pupils are equal, round, and reactive to light.  Neck: Normal range of motion. Neck supple. No thyromegaly present.  Cardiovascular: Normal rate, regular rhythm, normal heart sounds and intact distal pulses.   No murmur heard. Pulmonary/Chest: Effort normal and breath sounds normal. No respiratory distress. She has no wheezes.  Abdominal: Soft. Bowel sounds are normal. She exhibits no distension. There is no tenderness.  Musculoskeletal: Normal range of motion. She exhibits no edema or tenderness.  Neurological: She is alert and oriented to person, place, and time. She has normal reflexes. No cranial nerve deficit.  Skin: Skin is warm and dry.  Psychiatric: She has a normal mood and affect. Her behavior is normal. Judgment and thought content normal.  Vitals reviewed.  BP 115/83   Pulse 72   Temp 98.4 F (36.9 C) (Oral)   Ht 5\' 3"  (1.6 m)   Wt 157 lb 3.2 oz (71.3 kg)   BMI 27.85 kg/m        Assessment & Plan:  1. Abdominal pressure - Urinalysis, Complete; Future - Urinalysis, Complete  2. Acute cystitis with hematuria -Force fluids AZO over the counter X2 days RTO prn Culture pending - sulfamethoxazole-trimethoprim (BACTRIM DS) 800-160 MG tablet; Take 1 tablet by mouth 2 (two) times daily.  Dispense: 14 tablet; Refill: 0  3. Allergic rhinitis, unspecified allergic rhinitis type    Evelina Dun, FNP

## 2015-08-19 LAB — URINE CULTURE

## 2015-09-30 ENCOUNTER — Ambulatory Visit (INDEPENDENT_AMBULATORY_CARE_PROVIDER_SITE_OTHER): Payer: 59 | Admitting: Physician Assistant

## 2015-09-30 ENCOUNTER — Encounter: Payer: Self-pay | Admitting: Physician Assistant

## 2015-09-30 VITALS — BP 119/82 | HR 79 | Temp 97.5°F | Ht 63.0 in | Wt 152.0 lb

## 2015-09-30 DIAGNOSIS — K5901 Slow transit constipation: Secondary | ICD-10-CM

## 2015-09-30 DIAGNOSIS — L700 Acne vulgaris: Secondary | ICD-10-CM | POA: Insufficient documentation

## 2015-09-30 MED ORDER — DOXYCYCLINE HYCLATE 100 MG PO TABS: 100.0000 mg | ORAL_TABLET | Freq: Two times a day (BID) | ORAL | 6 refills | Status: DC

## 2015-09-30 MED ORDER — SENNOSIDES-DOCUSATE SODIUM 8.6-50 MG PO TABS: 1.0000 | ORAL_TABLET | Freq: Every day | ORAL | Status: DC

## 2015-09-30 NOTE — Patient Instructions (Signed)

## 2015-09-30 NOTE — Progress Notes (Signed)
BP 119/82 (BP Location: Right Arm, Patient Position: Sitting, Cuff Size: Normal)   Pulse 79   Temp 97.5 F (36.4 C) (Oral)   Ht 5\' 3"  (1.6 m)   Wt 152 lb (68.9 kg)   BMI 26.93 kg/m    Subjective:    Patient ID: Meredith Hall, female    DOB: [birth year], 25 y.o.   MRN: [id number]  HPI: Meredith Hall is a 25 y.o. female presenting on 09/30/2015 for Acne and Constipation Acne history since early teens, failed one oral antibiotic by intolerance. Failed topicals because it dried her out too much and irritated her eczema.  Did not want to take Accutane at that age.  Areas with firm cystic pimples on the mandibular line bilaterally.  Does not want to be on birth control at this time. Washes once daily with gentle soap. Applies aveeno lotion to face.  Constipation for 2 months, no change in diet or activity or quantity of food. Will go every 3-4 days. Has not tried any medications at all. Friend tried ducolax and cramped so she was afraid to try that.    Relevant past medical, surgical, family and social history reviewed and updated as indicated. Interim medical history since our last visit reviewed. Allergies and medications reviewed and updated. DATA REVIEWED: CHART IN EPIC  Review of Systems  Constitutional: Negative.  Negative for activity change, fatigue and fever.  HENT: Negative.   Eyes: Negative.   Respiratory: Negative.  Negative for cough.   Cardiovascular: Negative.  Negative for chest pain.  Gastrointestinal: Positive for abdominal distention, abdominal pain and constipation. Negative for diarrhea and vomiting.  Endocrine: Negative.   Genitourinary: Negative.  Negative for dysuria.  Musculoskeletal: Negative.   Skin: Positive for rash.  Neurological: Negative.     Per HPI unless specifically indicated above    Medication List       Accurate as of 09/30/15 12:52 PM. Always use your most recent med list.          cetirizine 10 MG tablet Commonly known  as:  ZYRTEC Take 1 tablet (10 mg total) by mouth daily.   doxycycline 100 MG tablet Commonly known as:  VIBRA-TABS Take 1 tablet (100 mg total) by mouth 2 (two) times daily. Maintain on 1 daily for prevention.   ibuprofen 600 MG tablet Commonly known as:  ADVIL,MOTRIN Take 1 tablet (600 mg total) by mouth every 8 (eight) hours as needed.   mometasone 50 MCG/ACT nasal spray Commonly known as:  NASONEX Place 2 sprays into the nose daily.   senna-docusate 8.6-50 MG tablet Commonly known as:  Senokot-S Take 1 tablet by mouth daily.   triamcinolone cream 0.1 % Commonly known as:  KENALOG Apply 1 application topically 2 (two) times daily. Do not use on face          Objective:    BP 119/82 (BP Location: Right Arm, Patient Position: Sitting, Cuff Size: Normal)   Pulse 79   Temp 97.5 F (36.4 C) (Oral)   Ht 5\' 3"  (1.6 m)   Wt 152 lb (68.9 kg)   BMI 26.93 kg/m   No Known Allergies  Wt Readings from Last 3 Encounters:  09/30/15 152 lb (68.9 kg)  08/17/15 157 lb 3.2 oz (71.3 kg)  07/01/15 154 lb 6.4 oz (70 kg)    Physical Exam  Constitutional: She is oriented to person, place, and time. She appears well-developed and well-nourished.  HENT:  Head: Normocephalic and atraumatic.  Eyes: Conjunctivae  and EOM are normal. Pupils are equal, round, and reactive to light.  Cardiovascular: Normal rate, regular rhythm, normal heart sounds and intact distal pulses.   Pulmonary/Chest: Effort normal and breath sounds normal.  Abdominal: Soft. Normal appearance and bowel sounds are normal. There is no hepatosplenomegaly, splenomegaly or hepatomegaly. There is no tenderness. There is no rebound.  Neurological: She is alert and oriented to person, place, and time. She has normal reflexes.  Skin: Skin is warm and dry. Rash noted. Rash is papular and nodular.  Acne, nodular on jawline bilterally  Psychiatric: She has a normal mood and affect. Her behavior is normal. Judgment and thought  content normal.  Nursing note and vitals reviewed.       Assessment & Plan:   1. Cystic acne Gentle wash twice daily, avoid picking, lighter moisturizer like cetaphil - doxycycline (VIBRA-TABS) 100 MG tablet; Take 1 tablet (100 mg total) by mouth 2 (two) times daily. Maintain on 1 daily for prevention.  Dispense: 40 tablet; Refill: 6  2. Slow transit constipation Fruits and vegatables - senna-docusate (SENOKOT-S) 8.6-50 MG tablet; Take 1 tablet by mouth daily. Consider yogurt daily or Align for probiotics.   Continue all other maintenance medications as listed above.  Follow up plan: Return prn worsening. Call if not improving.   Educational handout given for constipation.  Terald Sleeper PA-C [facility] [address]  [place], [place] [address] [phone]   09/30/2015, 12:52 PM

## 2015-10-01 ENCOUNTER — Other Ambulatory Visit: Payer: Self-pay

## 2015-10-01 DIAGNOSIS — J309 Allergic rhinitis, unspecified: Secondary | ICD-10-CM

## 2015-10-01 DIAGNOSIS — L700 Acne vulgaris: Secondary | ICD-10-CM

## 2015-10-01 MED ORDER — DOXYCYCLINE HYCLATE 100 MG PO TABS: 100.0000 mg | ORAL_TABLET | Freq: Two times a day (BID) | ORAL | 6 refills | Status: DC

## 2015-10-01 MED ORDER — MOMETASONE FUROATE 50 MCG/ACT NA SUSP: 2.0000 | Freq: Every day | NASAL | 12 refills | Status: DC

## 2015-10-01 MED FILL — DOXYCYCLINE HYCLATE 100 MG: 100 | 20 days supply | Qty: 40 | Fill #0

## 2015-10-01 MED FILL — MOMETASONE FUROATE 50 MCG S: 50 | 30 days supply | Qty: 17 | Fill #0

## 2015-10-01 NOTE — Progress Notes (Unsigned)
Patient would like doxy sent to [facility] instead and also would like a refill on her nasonex. Rx sent to correct pharmacy.

## 2015-12-13 ENCOUNTER — Telehealth: Payer: Self-pay

## 2015-12-13 DIAGNOSIS — Z0000 Encounter for general adult medical examination without abnormal findings: Secondary | ICD-10-CM

## 2015-12-13 NOTE — Telephone Encounter (Signed)
Patient has CPE 12/5 and would like to get blood work done before appointment so they will be back before appointment. Patient has family history of diabetes and thyroid and would like to have them checked. Please advise.

## 2015-12-13 NOTE — Telephone Encounter (Signed)
Patient aware.

## 2015-12-13 NOTE — Telephone Encounter (Signed)
Labs ordered. Please come fasting.   [name], MD [facility] 12/13/2015, 1:56 PM

## 2015-12-14 ENCOUNTER — Other Ambulatory Visit: Payer: 59

## 2015-12-14 ENCOUNTER — Other Ambulatory Visit: Payer: Self-pay

## 2015-12-14 DIAGNOSIS — Z0000 Encounter for general adult medical examination without abnormal findings: Secondary | ICD-10-CM

## 2015-12-15 LAB — CBC WITH DIFFERENTIAL/PLATELET
Basophils Absolute: 0 10*3/uL (ref 0.0–0.2)
Basos: 0 %
EOS (ABSOLUTE): 0.1 10*3/uL (ref 0.0–0.4)
EOS: 2 %
HEMATOCRIT: 40.8 % (ref 34.0–46.6)
HEMOGLOBIN: 13.6 g/dL (ref 11.1–15.9)
IMMATURE GRANS (ABS): 0 10*3/uL (ref 0.0–0.1)
IMMATURE GRANULOCYTES: 0 %
Lymphocytes Absolute: 1.6 10*3/uL (ref 0.7–3.1)
Lymphs: 26 %
MCH: 28.3 pg (ref 26.6–33.0)
MCHC: 33.3 g/dL (ref 31.5–35.7)
MCV: 85 fL (ref 79–97)
MONOCYTES: 8 %
Monocytes Absolute: 0.5 10*3/uL (ref 0.1–0.9)
NEUTROS PCT: 64 %
Neutrophils Absolute: 4.1 10*3/uL (ref 1.4–7.0)
Platelets: 244 10*3/uL (ref 150–379)
RBC: 4.81 x10E6/uL (ref 3.77–5.28)
RDW: 12.9 % (ref 12.3–15.4)
WBC: 6.4 10*3/uL (ref 3.4–10.8)

## 2015-12-15 LAB — CMP14+EGFR
ALBUMIN: 4.3 g/dL (ref 3.5–5.5)
ALK PHOS: 71 IU/L (ref 39–117)
ALT: 14 IU/L (ref 0–32)
AST: 12 IU/L (ref 0–40)
Albumin/Globulin Ratio: 1.8 (ref 1.2–2.2)
BUN/Creatinine Ratio: 28 — ABNORMAL HIGH (ref 9–23)
BUN: 15 mg/dL (ref 6–20)
Bilirubin Total: 0.4 mg/dL (ref 0.0–1.2)
CO2: 23 mmol/L (ref 18–29)
CREATININE: 0.53 mg/dL — AB (ref 0.57–1.00)
Calcium: 8.9 mg/dL (ref 8.7–10.2)
Chloride: 102 mmol/L (ref 96–106)
GFR calc Af Amer: 153 mL/min/{1.73_m2} (ref >59)
GFR calc non Af Amer: 132 mL/min/{1.73_m2} (ref >59)
Globulin, Total: 2.4 g/dL (ref 1.5–4.5)
Glucose: 89 mg/dL (ref 65–99)
Potassium: 4.6 mmol/L (ref 3.5–5.2)
Sodium: 138 mmol/L (ref 134–144)
Total Protein: 6.7 g/dL (ref 6.0–8.5)

## 2015-12-15 LAB — TSH: TSH: 2.4 u[IU]/mL (ref 0.450–4.500)

## 2015-12-15 LAB — LIPID PANEL
CHOL/HDL RATIO: 3.3 ratio (ref 0.0–4.4)
Cholesterol, Total: 121 mg/dL (ref 100–199)
HDL: 37 mg/dL — AB (ref >39)
LDL CALC: 73 mg/dL (ref 0–99)
TRIGLYCERIDES: 55 mg/dL (ref 0–149)
VLDL Cholesterol Cal: 11 mg/dL (ref 5–40)

## 2015-12-16 ENCOUNTER — Other Ambulatory Visit: Payer: Self-pay

## 2015-12-16 DIAGNOSIS — R109 Unspecified abdominal pain: Secondary | ICD-10-CM | POA: Diagnosis not present

## 2015-12-16 DIAGNOSIS — Z0000 Encounter for general adult medical examination without abnormal findings: Secondary | ICD-10-CM

## 2015-12-16 LAB — MICROSCOPIC EXAMINATION
BACTERIA UA: NONE SEEN
RBC, UA: NONE SEEN /hpf (ref 0–?)
RENAL EPITHEL UA: NONE SEEN /HPF
WBC UA: NONE SEEN /HPF (ref 0–?)

## 2015-12-16 LAB — URINALYSIS, COMPLETE
BILIRUBIN UA: NEGATIVE
GLUCOSE, UA: NEGATIVE
Ketones, UA: NEGATIVE
LEUKOCYTES UA: NEGATIVE
Nitrite, UA: NEGATIVE
PROTEIN UA: NEGATIVE
RBC, UA: NEGATIVE
Specific Gravity, UA: 1.02 (ref 1.005–1.030)
UUROB: 1 mg/dL (ref 0.2–1.0)
pH, UA: 7 (ref 5.0–7.5)

## 2015-12-16 NOTE — Addendum Note (Signed)
Addended by: Pollyann Kennedy F on: 12/16/2015 03:04 PM   Modules accepted: Orders

## 2015-12-21 ENCOUNTER — Ambulatory Visit (INDEPENDENT_AMBULATORY_CARE_PROVIDER_SITE_OTHER): Payer: 59 | Admitting: Family Medicine

## 2015-12-21 ENCOUNTER — Encounter: Payer: Self-pay | Admitting: Family Medicine

## 2015-12-21 VITALS — BP 124/85 | HR 74 | Temp 97.3°F | Ht 63.0 in | Wt 153.4 lb

## 2015-12-21 DIAGNOSIS — L309 Dermatitis, unspecified: Secondary | ICD-10-CM

## 2015-12-21 DIAGNOSIS — Z0000 Encounter for general adult medical examination without abnormal findings: Secondary | ICD-10-CM

## 2015-12-21 MED ORDER — CRISABOROLE 2 % EX OINT: 1.0000 "application " | TOPICAL_OINTMENT | Freq: Two times a day (BID) | CUTANEOUS | 5 refills | Status: DC

## 2015-12-21 NOTE — Patient Instructions (Signed)
Great to see you!  Cal and check on the price for eucrisa, you may need to look for a coupon.   Consider a pap smear, a GYN would be perfect  Try to work in 30 minutes of aerobic exercise 3-4 times a week

## 2015-12-21 NOTE — Progress Notes (Signed)
   HPI  Patient presents today here for physical exam.  Patient has no complaints. She has long-standing eczema primarily on her hands and at times with lesions on her body, she would like to change to [place] from Kenalog ointment.  Patient does not have any formal exercise routine. She watches her diet moderately.  She feels well overall and has no complaints.  She has family history of diabetes  She states it's been 2-3 years since her last Pap smear, she denies any abnormal result previously.  PMH: Smoking status noted ROS: Per HPI  Objective: BP 124/85   Pulse 74   Temp 97.3 F (36.3 C) (Oral)   Ht 5\' 3"  (1.6 m)   Wt 153 lb 6.4 oz (69.6 kg)   BMI 27.17 kg/m  Gen: NAD, alert, cooperative with exam HEENT: NCAT, EOMI, PERRL, nares clear, TMs normal bilaterally, oropharynx within normal limits CV: RRR, good S1/S2, no murmur Resp: CTABL, no wheezes, non-labored Abd: SNTND, BS present, no guarding or organomegaly Ext: No edema, warm Neuro: Alert and oriented, No gross deficits  Assessment and plan:  # Annual physical exam Normal exam, reviewed labs in detail Low HDL, discussed therapeutic lifestyle changes including over 100 minutes of aerobic exercise per week. 1 year visit unless needed sooner. Recommended pap smear  # Eczema Changing to eucrisa Kenalog cream if not tolerated or too expensive    [name], MD [facility] 12/21/2015, 1:30 PM

## 2016-02-17 ENCOUNTER — Telehealth: Payer: 59 | Admitting: Nurse Practitioner

## 2016-02-17 DIAGNOSIS — B9789 Other viral agents as the cause of diseases classified elsewhere: Secondary | ICD-10-CM | POA: Diagnosis not present

## 2016-02-17 DIAGNOSIS — J329 Chronic sinusitis, unspecified: Secondary | ICD-10-CM | POA: Diagnosis not present

## 2016-02-17 MED ORDER — FLUTICASONE PROPIONATE 50 MCG/ACT NA SUSP: 2.0000 | Freq: Every day | NASAL | 0 refills | Status: DC

## 2016-02-17 MED FILL — DOXYCYCLINE HYCLATE 100 MG: 100 | 20 days supply | Qty: 40 | Fill #1 | Status: TO

## 2016-02-17 MED FILL — FLUTICASONE PROP 50 MCG SPR: 50 | 30 days supply | Qty: 16 | Fill #0

## 2016-02-17 NOTE — Progress Notes (Signed)

## 2016-03-29 ENCOUNTER — Telehealth: Payer: Self-pay

## 2016-03-29 MED ORDER — FLUCONAZOLE 150 MG PO TABS: 150.0000 mg | ORAL_TABLET | Freq: Once | ORAL | 0 refills | Status: AC

## 2016-03-29 MED ORDER — FLUCONAZOLE 150 MG PO TABS: 150.0000 mg | ORAL_TABLET | Freq: Once | ORAL | 0 refills | Status: DC

## 2016-03-29 MED ORDER — SULFAMETHOXAZOLE-TRIMETHOPRIM 800-160 MG PO TABS: 1.0000 | ORAL_TABLET | Freq: Two times a day (BID) | ORAL | 0 refills | Status: DC

## 2016-03-29 NOTE — Addendum Note (Signed)
Addended by: Evelina Dun A on: 03/29/2016 02:14 PM   Modules accepted: Orders

## 2016-03-29 NOTE — Telephone Encounter (Signed)
Prescription sent to pharmacy.

## 2016-03-29 NOTE — Telephone Encounter (Signed)
Patient states she has been having flank pain and dysuria since Monday and would like something called in. Patient also states that with antibiotic she gets yeast infection so would also need diflucan.  If approved please send to [facility] in [place].

## 2016-03-31 MED FILL — MOMETASONE FUROATE 50 MCG S: 50 | 30 days supply | Qty: 17 | Fill #1 | Status: TO

## 2016-06-19 ENCOUNTER — Telehealth: Payer: Self-pay | Admitting: Family

## 2016-06-19 MED ORDER — TRIAMCINOLONE ACETONIDE 0.1 % EX CREA: 1.0000 "application " | TOPICAL_CREAM | Freq: Two times a day (BID) | CUTANEOUS | 5 refills | Status: DC

## 2016-06-19 MED ORDER — DESONIDE 0.05 % EX CREA: TOPICAL_CREAM | Freq: Two times a day (BID) | CUTANEOUS | 0 refills | Status: DC

## 2016-06-19 MED FILL — TRIAMCINOLONE 0.1% CREAM: 0.1 | 20 days supply | Qty: 60 | Fill #0

## 2016-06-19 MED FILL — DESONIDE 0.05% CREAM: 0.05 | 10 days supply | Qty: 30 | Fill #0

## 2016-06-19 NOTE — Telephone Encounter (Signed)
Prescription sent to pharmacy.

## 2016-06-20 MED FILL — MOMETASONE FUROATE 50 MCG S: 50 | 30 days supply | Qty: 17 | Fill #0

## 2016-09-12 MED FILL — MOMETASONE FUROATE 50 MCG S: 50 | 30 days supply | Qty: 17 | Fill #1

## 2016-09-12 MED FILL — DOXYCYCLINE HYC 100 MG TAB: 100 | 20 days supply | Qty: 40 | Fill #0

## 2016-10-18 ENCOUNTER — Other Ambulatory Visit: Payer: Self-pay | Admitting: *Deleted

## 2016-10-18 DIAGNOSIS — Z0000 Encounter for general adult medical examination without abnormal findings: Secondary | ICD-10-CM

## 2016-10-19 ENCOUNTER — Other Ambulatory Visit: Payer: 59

## 2016-10-19 DIAGNOSIS — Z0000 Encounter for general adult medical examination without abnormal findings: Secondary | ICD-10-CM | POA: Diagnosis not present

## 2016-10-20 LAB — LIPID PANEL
CHOL/HDL RATIO: 3.3 ratio (ref 0.0–4.4)
Cholesterol, Total: 130 mg/dL (ref 100–199)
HDL: 39 mg/dL — ABNORMAL LOW (ref >39)
LDL Calculated: 84 mg/dL (ref 0–99)
Triglycerides: 37 mg/dL (ref 0–149)
VLDL Cholesterol Cal: 7 mg/dL (ref 5–40)

## 2016-10-20 LAB — CMP14+EGFR
ALT: 10 IU/L (ref 0–32)
AST: 13 IU/L (ref 0–40)
Albumin/Globulin Ratio: 1.8 (ref 1.2–2.2)
Albumin: 4.4 g/dL (ref 3.5–5.5)
Alkaline Phosphatase: 67 IU/L (ref 39–117)
BILIRUBIN TOTAL: 0.4 mg/dL (ref 0.0–1.2)
BUN/Creatinine Ratio: 20 (ref 9–23)
BUN: 12 mg/dL (ref 6–20)
CO2: 24 mmol/L (ref 20–29)
Calcium: 9.4 mg/dL (ref 8.7–10.2)
Chloride: 105 mmol/L (ref 96–106)
Creatinine, Ser: 0.59 mg/dL (ref 0.57–1.00)
GFR calc non Af Amer: 127 mL/min/{1.73_m2} (ref >59)
GFR, EST AFRICAN AMERICAN: 146 mL/min/{1.73_m2} (ref >59)
GLUCOSE: 86 mg/dL (ref 65–99)
Globulin, Total: 2.4 g/dL (ref 1.5–4.5)
Potassium: 4.9 mmol/L (ref 3.5–5.2)
Sodium: 141 mmol/L (ref 134–144)
TOTAL PROTEIN: 6.8 g/dL (ref 6.0–8.5)

## 2016-10-20 LAB — TSH: TSH: 2.33 u[IU]/mL (ref 0.450–4.500)

## 2016-10-24 ENCOUNTER — Encounter: Payer: Self-pay | Admitting: Family

## 2016-10-24 ENCOUNTER — Ambulatory Visit (INDEPENDENT_AMBULATORY_CARE_PROVIDER_SITE_OTHER): Payer: 59 | Admitting: Family

## 2016-10-24 VITALS — BP 131/77 | HR 70 | Temp 97.2°F | Ht 63.0 in | Wt 143.0 lb

## 2016-10-24 DIAGNOSIS — Z0000 Encounter for general adult medical examination without abnormal findings: Secondary | ICD-10-CM

## 2016-10-24 DIAGNOSIS — L700 Acne vulgaris: Secondary | ICD-10-CM

## 2016-10-24 MED ORDER — DOXYCYCLINE HYCLATE 100 MG PO TABS: 100.0000 mg | ORAL_TABLET | Freq: Two times a day (BID) | ORAL | 6 refills | Status: DC

## 2016-10-24 MED ORDER — TRETINOIN 0.01 % EX GEL: Freq: Every day | CUTANEOUS | 0 refills | Status: DC

## 2016-10-24 MED FILL — DOXYCYCLINE HYC 100 MG TAB: 100 | 20 days supply | Qty: 40 | Fill #0

## 2016-10-24 NOTE — Addendum Note (Signed)
Addended by: Shelbie Ammons on: 10/24/2016 04:48 PM   Modules accepted: Orders

## 2016-10-24 NOTE — Progress Notes (Signed)
   Subjective:    Patient ID: Meredith Hall, female    DOB: [birth year], 26 y.o.   MRN: [id number]  HPI PT presents to the office today for CPE. Pt had lab work drawn on 10/19/16, lab discussed. Pt denies any headache, palpitations, SOB, or edema at this time.     Review of Systems  All other systems reviewed and are negative.      Objective:   Physical Exam  Constitutional: She is oriented to person, place, and time. She appears well-developed and well-nourished. No distress.  HENT:  Head: Normocephalic and atraumatic.  Right Ear: External ear normal.  Left Ear: External ear normal.  Nose: Nose normal.  Mouth/Throat: Oropharynx is clear and moist.  Eyes: Pupils are equal, round, and reactive to light.  Neck: Normal range of motion. Neck supple. No thyromegaly present.  Cardiovascular: Normal rate, regular rhythm, normal heart sounds and intact distal pulses.   No murmur heard. Pulmonary/Chest: Effort normal and breath sounds normal. No respiratory distress. She has no wheezes.  Abdominal: Soft. Bowel sounds are normal. She exhibits no distension. There is no tenderness.  Musculoskeletal: Normal range of motion. She exhibits no edema or tenderness.  Neurological: She is alert and oriented to person, place, and time.  Skin: Skin is warm and dry.  Psychiatric: She has a normal mood and affect. Her behavior is normal. Judgment and thought content normal.  Vitals reviewed.     BP 131/77   Pulse 70   Temp (!) 97.2 F (36.2 C) (Oral)   Ht 5\' 3"  (1.6 m)   Wt 143 lb (64.9 kg)   BMI 25.33 kg/m      Assessment & Plan:  1. Annual physical exam  2. Cystic acne Wash twice daily  Do not pick or squeeze - doxycycline (VIBRA-TABS) 100 MG tablet; Take 1 tablet (100 mg total) by mouth 2 (two) times daily. Maintain on 1 daily for prevention.  Dispense: 40 tablet; Refill: 6 - tretinoin (RETIN-A) 0.01 % gel; Apply topically at bedtime.  Dispense: 45 g; Refill: 0   Continue  all meds Labs pending Health Maintenance reviewed Diet and exercise encouraged RTO 1 year  Evelina Dun, FNP

## 2016-10-24 NOTE — Patient Instructions (Signed)
Acne Acne is a skin problem that causes pimples. Acne occurs when the pores in the skin get blocked. The pores may become infected with bacteria, or they may become red, sore, and swollen. Acne is a common skin problem, especially for teenagers. Acne usually goes away over time. What are the causes? Each pore contains an oil gland. Oil glands make an oily substance that is called sebum. Acne happens when these glands get plugged with sebum, dead skin cells, and dirt. Then, the bacteria that are normally found in the oil glands multiply and cause inflammation. Acne is commonly triggered by changes in your hormones. These hormonal changes can cause the oil glands to get bigger and to make more sebum. Factors that can make acne worse include:  Hormone changes during: ? Adolescence. ? Women's menstrual cycles. ? Pregnancy.  Oil-based cosmetics and hair products.  Harshly scrubbing the skin.  Strong soaps.  Stress.  Hormone problems that are due to certain diseases.  Long or oily hair rubbing against the skin.  Certain medicines.  Pressure from headbands, backpacks, or shoulder pads.  Exposure to certain oils and chemicals.  What increases the risk? This condition is more likely to develop in:  Teenagers.  People who have a family history of acne.  What are the signs or symptoms? Acne often occurs on the face, neck, chest, and upper back. Symptoms include:  Small, red bumps (pimples or papules).  Whiteheads.  Blackheads.  Small, pus-filled pimples (pustules).  Big, red pimples or pustules that feel tender.  More severe acne can cause:  An infected area that contains a collection of pus (abscess).  Hard, painful, fluid-filled sacs (cysts).  Scars.  How is this diagnosed? This condition is diagnosed with a medical history and physical exam. Blood tests may also be done. How is this treated? Treatment for this condition can vary depending on the severity of your  acne. Treatment may include:  Creams and lotions that prevent oil glands from clogging.  Creams and lotions that treat or prevent infections and inflammation.  Antibiotic medicines that are applied to the skin or taken as a pill.  Pills that decrease sebum production.  Birth control pills.  Light or laser treatments.  Surgery.  Injections of medicine into the affected areas.  Chemicals that cause peeling of the skin.  Your health care provider will also recommend the best way to take care of your skin. Good skin care is the most important part of treatment. Follow these instructions at home: Skin care Take care of your skin as told by your health care provider. You may be told to do these things:  Wash your skin gently at least two times each day, as well as: ? After you exercise. ? Before you go to bed.  Use mild soap.  Apply a water-based skin moisturizer after you wash your skin.  Use a sunscreen or sunblock with SPF 30 or greater. This is especially important if you are using acne medicines.  Choose cosmetics that will not plug your oil glands (are noncomedogenic).  Medicines  Take over-the-counter and prescription medicines only as told by your health care provider.  If you were prescribed an antibiotic medicine, apply or take it as told by your health care provider. Do not stop taking the antibiotic even if your condition improves. General instructions  Keep your hair clean and off of your face. If you have oily hair, shampoo your hair regularly or daily.  Avoid leaning your chin or   forehead against your hands.  Avoid wearing tight headbands or hats.  Avoid picking or squeezing your pimples. That can make your acne worse and cause scarring.  Keep all follow-up visits as told by your health care provider. This is important.  Shave gently and only when necessary.  Keep a food journal to figure out if any foods are linked with your acne. Contact a health  care provider if:  Your acne is not better after eight weeks.  Your acne gets worse.  You have a large area of skin that is red or tender.  You think that you are having side effects from any acne medicine. This information is not intended to replace advice given to you by your health care provider. Make sure you discuss any questions you have with your health care provider. Document Released: 12/31/1999 Document Revised: 09/03/2015 Document Reviewed: 03/11/2014 Elsevier Interactive Patient Education  2018 [employer] Inc.  

## 2016-10-25 ENCOUNTER — Encounter: Payer: 59 | Admitting: Family

## 2017-01-24 ENCOUNTER — Encounter: Payer: Self-pay | Admitting: Physician Assistant

## 2017-01-24 ENCOUNTER — Ambulatory Visit (INDEPENDENT_AMBULATORY_CARE_PROVIDER_SITE_OTHER): Payer: No Typology Code available for payment source | Admitting: Physician Assistant

## 2017-01-24 VITALS — BP 133/82 | HR 75 | Temp 98.8°F | Ht 63.0 in | Wt 148.0 lb

## 2017-01-24 DIAGNOSIS — J0110 Acute frontal sinusitis, unspecified: Secondary | ICD-10-CM | POA: Diagnosis not present

## 2017-01-24 MED ORDER — CEFDINIR 300 MG PO CAPS: 300.0000 mg | ORAL_CAPSULE | Freq: Two times a day (BID) | ORAL | 0 refills | Status: DC

## 2017-01-24 NOTE — Progress Notes (Signed)
BP 133/82   Pulse 75   Temp 98.8 F (37.1 C) (Oral)   Ht 5\' 3"  (1.6 m)   Wt 148 lb (67.1 kg)   BMI 26.22 kg/m    Subjective:    Patient ID: Meredith Hall, female    DOB: [birth year], 27 y.o.   MRN: [id number]  HPI: Meredith Hall is a 27 y.o. female presenting on 01/24/2017 for Sinus Problem (teeth pain )  This patient has had many days of sinus headache and postnasal drainage. There is copious drainage at times. Denies any fever at this time. There has been a history of sinus infections in the past.  No history of sinus surgery. There is cough at night. It has become more prevalent in recent days.   Relevant past medical, surgical, family and social history reviewed and updated as indicated. Allergies and medications reviewed and updated.  Past Medical History:  Diagnosis Date  . Eczema     History reviewed. No pertinent surgical history.  Review of Systems  Constitutional: Positive for chills and fatigue. Negative for activity change and appetite change.  HENT: Positive for congestion, postnasal drip, sinus pressure, sinus pain and sore throat.   Eyes: Negative.   Respiratory: Negative for cough and wheezing.   Cardiovascular: Negative.  Negative for chest pain, palpitations and leg swelling.  Gastrointestinal: Negative.   Genitourinary: Negative.   Musculoskeletal: Negative.   Skin: Negative.   Neurological: Positive for headaches.    Allergies as of 01/24/2017   No Known Allergies     Medication List        Accurate as of 01/24/17 11:16 AM. Always use your most recent med list.          cefdinir 300 MG capsule Commonly known as:  OMNICEF Take 1 capsule (300 mg total) by mouth 2 (two) times daily. 1 po BID   cetirizine 10 MG tablet Commonly known as:  ZYRTEC Take 1 tablet (10 mg total) by mouth daily.   desonide 0.05 % cream Commonly known as:  DESOWEN Apply topically 2 (two) times daily.   doxycycline 100 MG tablet Commonly known as:   VIBRA-TABS Take 1 tablet (100 mg total) by mouth 2 (two) times daily. Maintain on 1 daily for prevention.   fluticasone 50 MCG/ACT nasal spray Commonly known as:  FLONASE Place 2 sprays into both nostrils daily.   ibuprofen 600 MG tablet Commonly known as:  ADVIL,MOTRIN Take 1 tablet (600 mg total) by mouth every 8 (eight) hours as needed.   triamcinolone cream 0.1 % Commonly known as:  KENALOG Apply 1 application topically 2 (two) times daily. Do not use on face          Objective:    BP 133/82   Pulse 75   Temp 98.8 F (37.1 C) (Oral)   Ht 5\' 3"  (1.6 m)   Wt 148 lb (67.1 kg)   BMI 26.22 kg/m   No Known Allergies  Physical Exam  Constitutional: She is oriented to person, place, and time. She appears well-developed and well-nourished.  HENT:  Head: Normocephalic and atraumatic.  Right Ear: Tympanic membrane and external ear normal. No middle ear effusion.  Left Ear: Tympanic membrane and external ear normal.  No middle ear effusion.  Nose: Mucosal edema and rhinorrhea present. Right sinus exhibits no maxillary sinus tenderness. Left sinus exhibits no maxillary sinus tenderness.  Mouth/Throat: Uvula is midline. Posterior oropharyngeal erythema present.  Eyes: Conjunctivae and EOM are normal. Pupils are equal,  round, and reactive to light. Right eye exhibits no discharge. Left eye exhibits no discharge.  Neck: Normal range of motion.  Cardiovascular: Normal rate, regular rhythm and normal heart sounds.  Pulmonary/Chest: Effort normal and breath sounds normal. No respiratory distress. She has no wheezes.  Abdominal: Soft.  Lymphadenopathy:    She has no cervical adenopathy.  Neurological: She is alert and oriented to person, place, and time.  Skin: Skin is warm and dry.  Psychiatric: She has a normal mood and affect.        Assessment & Plan:   1. Acute non-recurrent frontal sinusitis - cefdinir (OMNICEF) 300 MG capsule; Take 1 capsule (300 mg total) by mouth 2  (two) times daily. 1 po BID  Dispense: 20 capsule; Refill: 0     Current Outpatient Medications:  .  cetirizine (ZYRTEC) 10 MG tablet, Take 1 tablet (10 mg total) by mouth daily., Disp: 30 tablet, Rfl: 11 .  desonide (DESOWEN) 0.05 % cream, Apply topically 2 (two) times daily., Disp: 30 g, Rfl: 0 .  doxycycline (VIBRA-TABS) 100 MG tablet, Take 1 tablet (100 mg total) by mouth 2 (two) times daily. Maintain on 1 daily for prevention., Disp: 40 tablet, Rfl: 6 .  fluticasone (FLONASE) 50 MCG/ACT nasal spray, Place 2 sprays into both nostrils daily., Disp: 16 g, Rfl: 0 .  ibuprofen (ADVIL,MOTRIN) 600 MG tablet, Take 1 tablet (600 mg total) by mouth every 8 (eight) hours as needed., Disp: 30 tablet, Rfl: 0 .  triamcinolone cream (KENALOG) 0.1 %, Apply 1 application topically 2 (two) times daily. Do not use on face, Disp: 60 g, Rfl: 5 .  cefdinir (OMNICEF) 300 MG capsule, Take 1 capsule (300 mg total) by mouth 2 (two) times daily. 1 po BID, Disp: 20 capsule, Rfl: 0 Continue all other maintenance medications as listed above.  Follow up plan: No Follow-up on file.  Educational handout given for [place] PA-C [place] [address]  [place],  [address] [phone]   01/24/2017, 11:16 AM

## 2017-01-24 NOTE — Patient Instructions (Signed)
In a few days you may receive a survey in the mail or online from [employer] regarding your visit with us today. Please take a moment to fill this out. Your feedback is very important to our whole office. It can help us better understand your needs as well as improve your experience and satisfaction. Thank you for taking your time to complete it. We care about you.  Lucendia Leard, PA-C  

## 2017-01-26 ENCOUNTER — Telehealth: Payer: Self-pay

## 2017-01-26 MED ORDER — FLUCONAZOLE 150 MG PO TABS: ORAL_TABLET | ORAL | 0 refills | Status: DC

## 2017-01-26 NOTE — Addendum Note (Signed)
Addended by: Terald Sleeper on: 01/26/2017 11:36 AM   Modules accepted: Orders

## 2017-01-26 NOTE — Telephone Encounter (Signed)
Patient seen Meredith Hall 01/25/16 and states that you said you would send her in a diflucan - requesting it sent to [facility]. Please advise and send if approved

## 2017-01-26 NOTE — Telephone Encounter (Signed)
Patient aware.

## 2017-02-23 ENCOUNTER — Other Ambulatory Visit: Payer: No Typology Code available for payment source

## 2017-02-23 ENCOUNTER — Ambulatory Visit: Payer: No Typology Code available for payment source | Admitting: Family

## 2017-02-23 DIAGNOSIS — J029 Acute pharyngitis, unspecified: Secondary | ICD-10-CM

## 2017-02-23 LAB — CULTURE, GROUP A STREP

## 2017-02-23 LAB — RAPID STREP SCREEN (MED CTR MEBANE ONLY): STREP GP A AG, IA W/REFLEX: NEGATIVE

## 2017-03-06 ENCOUNTER — Ambulatory Visit (INDEPENDENT_AMBULATORY_CARE_PROVIDER_SITE_OTHER): Payer: No Typology Code available for payment source | Admitting: Family

## 2017-03-06 ENCOUNTER — Encounter: Payer: Self-pay | Admitting: Family

## 2017-03-06 VITALS — BP 108/69 | HR 74 | Temp 98.1°F | Ht 63.0 in | Wt 148.0 lb

## 2017-03-06 DIAGNOSIS — L089 Local infection of the skin and subcutaneous tissue, unspecified: Secondary | ICD-10-CM

## 2017-03-06 DIAGNOSIS — L700 Acne vulgaris: Secondary | ICD-10-CM

## 2017-03-06 DIAGNOSIS — B9689 Other specified bacterial agents as the cause of diseases classified elsewhere: Secondary | ICD-10-CM

## 2017-03-06 DIAGNOSIS — L309 Dermatitis, unspecified: Secondary | ICD-10-CM

## 2017-03-06 DIAGNOSIS — B372 Candidiasis of skin and nail: Secondary | ICD-10-CM

## 2017-03-06 MED ORDER — CEPHALEXIN 500 MG PO CAPS: 500.0000 mg | ORAL_CAPSULE | Freq: Two times a day (BID) | ORAL | 0 refills | Status: DC

## 2017-03-06 MED ORDER — NYSTATIN 100000 UNIT/GM EX CREA: 1.0000 "application " | TOPICAL_CREAM | Freq: Two times a day (BID) | CUTANEOUS | 0 refills | Status: DC

## 2017-03-06 NOTE — Progress Notes (Signed)
   Subjective:    Patient ID: Meredith Hall, female    DOB: 11-18-1990, 27 y.o.   MRN: [id number]  HPI PT complaining of dry skin and has eczema. Complaining of dry skin on her back, stomach, and face. PT uses Aveeno lotion daily. However, states her upper back "itches" constant and has "sores" that won't heal. She reports this rash has been there for over a month.    PT has cystic acne and had taken doxycycline that has helped greatly, but has stopped it now. PT states she is nervous about putting products on her face. She reports she washes her face daily with Dove.   She is is also complaining of a itching red rash on her stomach that she noticed after she sweats a lot.    Review of Systems  Skin:       Dry skin  All other systems reviewed and are negative.      Objective:   Physical Exam  Constitutional: She is oriented to person, place, and time. She appears well-developed and well-nourished. No distress.  HENT:  Head: Normocephalic and atraumatic.  Right Ear: External ear normal.  Left Ear: External ear normal.  Nose: Nose normal.  Mouth/Throat: Oropharynx is clear and moist.  Eyes: Pupils are equal, round, and reactive to light.  Neck: Normal range of motion. Neck supple. No thyromegaly present.  Cardiovascular: Normal rate, regular rhythm, normal heart sounds and intact distal pulses.  No murmur heard. Pulmonary/Chest: Effort normal and breath sounds normal. No respiratory distress. She has no wheezes.  Abdominal: Soft. Bowel sounds are normal. She exhibits no distension. There is no tenderness.  Musculoskeletal: Normal range of motion. She exhibits no edema or tenderness.  Neurological: She is alert and oriented to person, place, and time.  Skin: Skin is warm and dry. Rash noted. Rash is pustular (pustular rash with erythemas and warmth on upper back). There is erythema.     Psychiatric: She has a normal mood and affect. Her behavior is normal. Judgment and  thought content normal.  Vitals reviewed.   BP 108/69   Pulse 74   Temp 98.1 F (36.7 C) (Oral)   Ht 5\' 3"  (1.6 m)   Wt 148 lb (67.1 kg)   BMI 26.22 kg/m      Assessment & Plan:  1. Eczema, unspecified type Discussed limiting hot showers and even taking a shower every other day Applying thick ointment lotion on immediately after getting out the shower - Ambulatory referral to Dermatology  2. Cystic acne - Ambulatory referral to Dermatology  3. Skin infection, bacterial Do not scratch - cephALEXin (KEFLEX) 500 MG capsule; Take 1 capsule (500 mg total) by mouth 2 (two) times daily.  Dispense: 14 capsule; Refill: 0 - Ambulatory referral to Dermatology  4. Candidal skin infection Keep clean and dry - nystatin cream (MYCOSTATIN); Apply 1 application topically 2 (two) times daily.  Dispense: 30 g; Refill: 0 - Ambulatory referral to Dermatology    Evelina Dun, FNP

## 2017-03-06 NOTE — Patient Instructions (Signed)

## 2017-05-02 ENCOUNTER — Ambulatory Visit (INDEPENDENT_AMBULATORY_CARE_PROVIDER_SITE_OTHER): Payer: No Typology Code available for payment source | Admitting: Family Medicine

## 2017-05-02 ENCOUNTER — Encounter: Payer: Self-pay | Admitting: Family Medicine

## 2017-05-02 VITALS — BP 111/64 | HR 76 | Temp 98.4°F | Ht 63.0 in | Wt 152.0 lb

## 2017-05-02 DIAGNOSIS — H6983 Other specified disorders of Eustachian tube, bilateral: Secondary | ICD-10-CM

## 2017-05-02 DIAGNOSIS — H6501 Acute serous otitis media, right ear: Secondary | ICD-10-CM | POA: Diagnosis not present

## 2017-05-02 MED ORDER — CEFDINIR 300 MG PO CAPS
300.0000 mg | ORAL_CAPSULE | Freq: Two times a day (BID) | ORAL | 0 refills | Status: DC
Start: 2017-05-02 — End: 2017-06-14

## 2017-05-02 NOTE — Patient Instructions (Signed)
Perform the at home osteopathic manipulative techniques that we discussed today to help drain your ears and sinuses.  I have put you on an antibiotic for the next 10 days to see if this might help with the right-sided ear pain.  Consider using sinus irrigation like a Nettie pot to help clean the sinuses out.  You may continue the Flonase nasal spray.   Eustachian Tube Dysfunction The eustachian tube connects the middle ear to the back of the nose. It regulates air pressure in the middle ear by allowing air to move between the ear and nose. It also helps to drain fluid from the middle ear space. When the eustachian tube does not function properly, air pressure, fluid, or both can build up in the middle ear. Eustachian tube dysfunction can affect one or both ears. What are the causes? This condition happens when the eustachian tube becomes blocked or cannot open normally. This may result from:  Ear infections.  Colds and other upper respiratory infections.  Allergies.  Irritation, such as from cigarette smoke or acid from the stomach coming up into the esophagus (gastroesophageal reflux).  Sudden changes in air pressure, such as from descending in an airplane.  Abnormal growths in the nose or throat, such as nasal polyps, tumors, or enlarged tissue at the back of the throat (adenoids).  What increases the risk? This condition may be more likely to develop in people who smoke and people who are overweight. Eustachian tube dysfunction may also be more likely to develop in children, especially children who have:  Certain birth defects of the mouth, such as cleft palate.  Large tonsils and adenoids.  What are the signs or symptoms? Symptoms of this condition may include:  A feeling of fullness in the ear.  Ear pain.  Clicking or popping noises in the ear.  Ringing in the ear.  Hearing loss.  Loss of balance.  Symptoms may get worse when the air pressure around you changes, such  as when you travel to an area of high elevation or fly on an airplane. How is this diagnosed? This condition may be diagnosed based on:  Your symptoms.  A physical exam of your ear, nose, and throat.  Tests, such as those that measure: ? The movement of your eardrum (tympanogram). ? Your hearing (audiometry).  How is this treated? Treatment depends on the cause and severity of your condition. If your symptoms are mild, you may be able to relieve your symptoms by moving air into ("popping") your ears. If you have symptoms of fluid in your ears, treatment may include:  Decongestants.  Antihistamines.  Nasal sprays or ear drops that contain medicines that reduce swelling (steroids).  In some cases, you may need to have a procedure to drain the fluid in your eardrum (myringotomy). In this procedure, a small tube is placed in the eardrum to:  Drain the fluid.  Restore the air in the middle ear space.  Follow these instructions at home:  Take over-the-counter and prescription medicines only as told by your health care provider.  Use techniques to help pop your ears as recommended by your health care provider. These may include: ? Chewing gum. ? Yawning. ? Frequent, forceful swallowing. ? Closing your mouth, holding your nose closed, and gently blowing as if you are trying to blow air out of your nose.  Do not do any of the following until your health care provider approves: ? Travel to high altitudes. ? Fly in airplanes. ?  Work in a Pension scheme manager or room. ? Scuba dive.  Keep your ears dry. Dry your ears completely after showering or bathing.  Do not smoke.  Keep all follow-up visits as told by your health care provider. This is important. Contact a health care provider if:  Your symptoms do not go away after treatment.  Your symptoms come back after treatment.  You are unable to pop your ears.  You have: ? A fever. ? Pain in your ear. ? Pain in your head or  neck. ? Fluid draining from your ear.  Your hearing suddenly changes.  You become very dizzy.  You lose your balance. This information is not intended to replace advice given to you by your health care provider. Make sure you discuss any questions you have with your health care provider. Document Released: 01/29/2015 Document Revised: 06/10/2015 Document Reviewed: 01/21/2014 Elsevier Interactive Patient Education  [employer].

## 2017-05-02 NOTE — Progress Notes (Signed)
Subjective: CC: right ear pain, left ear fullness PCP: Sharion Balloon, FNP H[id number] Meredith Hall is a 27 y.o. female presenting to clinic today for:  Ear pain Patient reports a long-standing history of bilateral ear fullness.  She started taking Flonase nasal spray about 2 weeks ago in efforts to improve symptoms.  She has noticed no improvement in symptoms, and actually feels like she is starting to have a left-sided sinus fullness.  She also takes a daily Zyrtec.  She notes an episode of dizziness yesterday that has since resolved.  Denies any fevers, chills, nausea, vomiting, sinus congestion, nasal congestion or chest congestion.  She is also been using pseudoephedrine, with little improvement in symptoms.  Her right ear seems a bit more painful than normal.  She notes decreased hearing on her left ear secondary to fluid buildup.  She is wondering if perhaps she should see an ear nose and throat doctor for further evaluation.   ROS: Per HPI  No Known Allergies Past Medical History:  Diagnosis Date  . Eczema     Current Outpatient Medications:  .  cetirizine (ZYRTEC) 10 MG tablet, Take 1 tablet (10 mg total) by mouth daily., Disp: 30 tablet, Rfl: 11 .  desonide (DESOWEN) 0.05 % cream, Apply topically 2 (two) times daily., Disp: 30 g, Rfl: 0 .  doxycycline (VIBRA-TABS) 100 MG tablet, Take 1 tablet (100 mg total) by mouth 2 (two) times daily. Maintain on 1 daily for prevention., Disp: 40 tablet, Rfl: 6 .  ibuprofen (ADVIL,MOTRIN) 600 MG tablet, Take 1 tablet (600 mg total) by mouth every 8 (eight) hours as needed., Disp: 30 tablet, Rfl: 0 .  triamcinolone cream (KENALOG) 0.1 %, Apply 1 application topically 2 (two) times daily. Do not use on face, Disp: 60 g, Rfl: 5 .  fluticasone (FLONASE) 50 MCG/ACT nasal spray, Place 2 sprays into both nostrils daily., Disp: 16 g, Rfl: 0 Social History   Socioeconomic History  . Marital status: Single    Spouse name: Not on file  . Number of  children: Not on file  . Years of education: Not on file  . Highest education level: Not on file  Occupational History  . Not on file  Social Needs  . Financial resource strain: Not on file  . Food insecurity:    Worry: Not on file    Inability: Not on file  . Transportation needs:    Medical: Not on file    Non-medical: Not on file  Tobacco Use  . Smoking status: Never Smoker  . Smokeless tobacco: Never Used  Substance and Sexual Activity  . Alcohol use: No  . Drug use: No  . Sexual activity: Not on file  Lifestyle  . Physical activity:    Days per week: Not on file    Minutes per session: Not on file  . Stress: Not on file  Relationships  . Social connections:    Talks on phone: Not on file    Gets together: Not on file    Attends religious service: Not on file    Active member of club or organization: Not on file    Attends meetings of clubs or organizations: Not on file    Relationship status: Not on file  . Intimate partner violence:    Fear of current or ex partner: Not on file    Emotionally abused: Not on file    Physically abused: Not on file    Forced sexual activity: Not on  file  Other Topics Concern  . Not on file  Social History Narrative  . Not on file   Family History  Problem Relation Age of Onset  . Thyroid disease Mother     Objective: Office vital signs reviewed. BP 111/64   Pulse 76   Temp 98.4 F (36.9 C) (Oral)   Ht 5\' 3"  (1.6 m)   Wt 152 lb (68.9 kg)   BMI 26.93 kg/m   Physical Examination:  General: Awake, alert, well nourished, well appearing female, No acute distress HEENT: Normal    Neck: No masses palpated. No lymphadenopathy    Ears: Tympanic membranes intact, dulled light reflex R>L, Right external auditory canal with mild erythema especially near the right TM, no bulging blood fluid level is appreciated bilaterally.    Eyes: PERRLA, extraocular membranes intact, sclera white    Nose: nasal turbinates moist, clear nasal  discharge    Throat: moist mucus membranes, no erythema, no tonsillar exudate.  Airway is patent  Assessment/ Plan: 27 y.o. female   1. Non-recurrent acute serous otitis media of right ear Given duration of symptoms and worsening of ear pain with visible fluid level behind right TM, will empirically treat for any bacterial component with Omnicef 300 mg p.o. twice daily for the next 10 days to cover for any ear infections.  I do think that this is more likely secondary to eustachian tube dysfunction.  Osteopathic manipulative technique for eustachian tube drainage and sinus drainage or discussed with the patient and demonstrated.  She was able to teach back this maneuver.  Handout provided on eustachian tube dysfunction.  Continue Flonase, antihistamine.  I have referred her to ear nose and throat per her request.  She will follow-up as needed. - Ambulatory referral to ENT  2. Eustachian tube dysfunction, bilateral - Ambulatory referral to ENT   Orders Placed This Encounter  Procedures  . Ambulatory referral to ENT    Referral Priority:   Routine    Referral Type:   Consultation    Referral Reason:   Specialty Services Required    Requested Specialty:   Otolaryngology    Number of Visits Requested:   1   Meds ordered this encounter  Medications  . cefdinir (OMNICEF) 300 MG capsule    Sig: Take 1 capsule (300 mg total) by mouth 2 (two) times daily. 1 po BID    Dispense:  20 capsule    Refill:  [place], DO [place] [phone]

## 2017-05-10 ENCOUNTER — Telehealth: Payer: Self-pay | Admitting: Family

## 2017-05-10 MED ORDER — FLUCONAZOLE 150 MG PO TABS: ORAL_TABLET | ORAL | 0 refills | Status: DC

## 2017-05-10 NOTE — Telephone Encounter (Signed)
rx sent in and pt is aware.

## 2017-05-19 ENCOUNTER — Other Ambulatory Visit: Payer: Self-pay | Admitting: Family

## 2017-05-19 MED ORDER — FLUCONAZOLE 150 MG PO TABS: ORAL_TABLET | ORAL | 0 refills | Status: DC

## 2017-06-14 ENCOUNTER — Ambulatory Visit (INDEPENDENT_AMBULATORY_CARE_PROVIDER_SITE_OTHER): Payer: No Typology Code available for payment source | Admitting: Family

## 2017-06-14 ENCOUNTER — Encounter: Payer: Self-pay | Admitting: Family

## 2017-06-14 VITALS — BP 116/84 | HR 68 | Temp 98.0°F | Ht 63.0 in | Wt 146.6 lb

## 2017-06-14 DIAGNOSIS — L209 Atopic dermatitis, unspecified: Secondary | ICD-10-CM

## 2017-06-14 DIAGNOSIS — Z23 Encounter for immunization: Secondary | ICD-10-CM | POA: Diagnosis not present

## 2017-06-14 DIAGNOSIS — Z0000 Encounter for general adult medical examination without abnormal findings: Secondary | ICD-10-CM

## 2017-06-14 DIAGNOSIS — J301 Allergic rhinitis due to pollen: Secondary | ICD-10-CM

## 2017-06-14 MED ORDER — FLUTICASONE PROPIONATE 50 MCG/ACT NA SUSP: 2.0000 | Freq: Every day | NASAL | 0 refills | Status: DC

## 2017-06-14 MED ORDER — CRISABOROLE 2 % EX OINT: 1.0000 "application " | TOPICAL_OINTMENT | Freq: Two times a day (BID) | CUTANEOUS | 2 refills | Status: DC

## 2017-06-14 NOTE — Progress Notes (Signed)
   Subjective:    Patient ID: Meredith Hall, female    DOB: [birth year], 27 y.o.   MRN: [id number]  Chief Complaint  Patient presents with  . CPE    HPI Pt presents to the office today for CPE without pap. Pt states she had her pap within the last two years and it was normal. Pt denies any headache, palpitations, SOB, or edema at this time.   States her atopic dermatis and flared up since the hot weather. States she is having increased erythemas of her right eye lid. She states she believes she rubs her eye at night when she is sleeping, because the redness is worse in the morning.  She has a derm appt in July.    Review of Systems  Skin: Positive for rash.  All other systems reviewed and are negative.      Objective:   Physical Exam  Constitutional: She is oriented to person, place, and time. She appears well-developed and well-nourished. No distress.  HENT:  Head: Normocephalic and atraumatic.  Right Ear: External ear normal.  Left Ear: External ear normal.  Nose: Mucosal edema and rhinorrhea present.  Mouth/Throat: Oropharynx is clear and moist.  Eyes: Pupils are equal, round, and reactive to light.  Neck: Normal range of motion. Neck supple. No thyromegaly present.  Cardiovascular: Normal rate, regular rhythm, normal heart sounds and intact distal pulses.  No murmur heard. Pulmonary/Chest: Effort normal and breath sounds normal. No respiratory distress. She has no wheezes.  Abdominal: Soft. Bowel sounds are normal. She exhibits no distension. There is no tenderness.  Musculoskeletal: Normal range of motion. She exhibits no edema or tenderness.  Neurological: She is alert and oriented to person, place, and time. She has normal reflexes. No cranial nerve deficit.  Skin: Skin is warm and dry. No erythema.  Dry erythemas skin on right eye lid, forehead, and right wrist  Psychiatric: She has a normal mood and affect. Her behavior is normal. Judgment and thought content  normal.  Vitals reviewed.     Ht '5\' 3"'$  (1.6 m)   Wt 146 lb 9.6 oz (66.5 kg)   BMI 25.97 kg/m      Assessment & Plan:  AERITH CANAL comes in today with chief complaint of CPE   Diagnosis and orders addressed:  1. Annual physical exam - CBC with Differential/Platelet; Future - CMP14+EGFR; Future - Lipid panel; Future - TSH; Future  2. Atopic dermatitis, unspecified type Will try Eucrisa for her face flare ups - CBC with Differential/Platelet; Future - CMP14+EGFR; Future - Crisaborole (EUCRISA) 2 % OINT; Apply 1 application topically 2 (two) times daily.  Dispense: 60 g; Refill: 2  3. Allergic rhinitis due to pollen, unspecified seasonality - fluticasone (FLONASE) 50 MCG/ACT nasal spray; Place 2 sprays into both nostrils daily for 10 days.  Dispense: 16 g; Refill: 0    Labs pending Health Maintenance reviewed-TDAP given today, will get pap faxed and scanned into her chart Diet and exercise encouraged  Follow up plan: 1 year    Evelina Dun, FNP

## 2017-06-14 NOTE — Addendum Note (Signed)
Addended by: Shelbie Ammons on: 06/14/2017 03:39 PM   Modules accepted: Orders

## 2017-06-14 NOTE — Patient Instructions (Signed)

## 2017-06-15 ENCOUNTER — Other Ambulatory Visit: Payer: No Typology Code available for payment source

## 2017-06-15 ENCOUNTER — Encounter: Payer: No Typology Code available for payment source | Admitting: Family

## 2017-06-15 DIAGNOSIS — L209 Atopic dermatitis, unspecified: Secondary | ICD-10-CM

## 2017-06-15 DIAGNOSIS — Z0000 Encounter for general adult medical examination without abnormal findings: Secondary | ICD-10-CM

## 2017-06-16 LAB — CBC WITH DIFFERENTIAL/PLATELET
BASOS ABS: 0 10*3/uL (ref 0.0–0.2)
Basos: 1 %
EOS (ABSOLUTE): 0.2 10*3/uL (ref 0.0–0.4)
Eos: 3 %
HEMATOCRIT: 42.8 % (ref 34.0–46.6)
Hemoglobin: 14.2 g/dL (ref 11.1–15.9)
Immature Grans (Abs): 0 10*3/uL (ref 0.0–0.1)
Immature Granulocytes: 0 %
LYMPHS ABS: 1.4 10*3/uL (ref 0.7–3.1)
Lymphs: 26 %
MCH: 28.3 pg (ref 26.6–33.0)
MCHC: 33.2 g/dL (ref 31.5–35.7)
MCV: 85 fL (ref 79–97)
MONOCYTES: 9 %
MONOS ABS: 0.5 10*3/uL (ref 0.1–0.9)
NEUTROS ABS: 3.4 10*3/uL (ref 1.4–7.0)
Neutrophils: 61 %
Platelets: 253 10*3/uL (ref 150–450)
RBC: 5.01 x10E6/uL (ref 3.77–5.28)
RDW: 13.3 % (ref 12.3–15.4)
WBC: 5.5 10*3/uL (ref 3.4–10.8)

## 2017-06-16 LAB — CMP14+EGFR
A/G RATIO: 1.9 (ref 1.2–2.2)
ALK PHOS: 60 IU/L (ref 39–117)
ALT: 13 IU/L (ref 0–32)
AST: 12 IU/L (ref 0–40)
Albumin: 4.7 g/dL (ref 3.5–5.5)
BILIRUBIN TOTAL: 0.6 mg/dL (ref 0.0–1.2)
BUN / CREAT RATIO: 16 (ref 9–23)
BUN: 11 mg/dL (ref 6–20)
CHLORIDE: 104 mmol/L (ref 96–106)
CO2: 21 mmol/L (ref 20–29)
Calcium: 9.5 mg/dL (ref 8.7–10.2)
Creatinine, Ser: 0.68 mg/dL (ref 0.57–1.00)
GFR calc non Af Amer: 120 mL/min/{1.73_m2} (ref >59)
GFR, EST AFRICAN AMERICAN: 139 mL/min/{1.73_m2} (ref >59)
GLUCOSE: 81 mg/dL (ref 65–99)
Globulin, Total: 2.5 g/dL (ref 1.5–4.5)
Potassium: 4.1 mmol/L (ref 3.5–5.2)
SODIUM: 140 mmol/L (ref 134–144)
TOTAL PROTEIN: 7.2 g/dL (ref 6.0–8.5)

## 2017-06-16 LAB — LIPID PANEL
CHOLESTEROL TOTAL: 123 mg/dL (ref 100–199)
Chol/HDL Ratio: 3.2 ratio (ref 0.0–4.4)
HDL: 39 mg/dL — AB (ref >39)
LDL CALC: 76 mg/dL (ref 0–99)
TRIGLYCERIDES: 40 mg/dL (ref 0–149)
VLDL CHOLESTEROL CAL: 8 mg/dL (ref 5–40)

## 2017-06-16 LAB — TSH: TSH: 3.51 u[IU]/mL (ref 0.450–4.500)

## 2017-06-21 ENCOUNTER — Other Ambulatory Visit: Payer: Self-pay | Admitting: *Deleted

## 2017-06-21 DIAGNOSIS — J309 Allergic rhinitis, unspecified: Secondary | ICD-10-CM

## 2017-06-21 MED ORDER — MOMETASONE FUROATE 50 MCG/ACT NA SUSP: 2.0000 | Freq: Every day | NASAL | 12 refills | Status: DC

## 2017-06-23 ENCOUNTER — Other Ambulatory Visit: Payer: Self-pay | Admitting: *Deleted

## 2017-06-23 MED ORDER — TRIAMCINOLONE ACETONIDE 0.1 % EX CREA: 1.0000 "application " | TOPICAL_CREAM | Freq: Two times a day (BID) | CUTANEOUS | 5 refills | Status: DC

## 2017-08-01 ENCOUNTER — Other Ambulatory Visit: Payer: Self-pay | Admitting: *Deleted

## 2017-08-01 DIAGNOSIS — J309 Allergic rhinitis, unspecified: Secondary | ICD-10-CM

## 2017-08-01 MED ORDER — MOMETASONE FUROATE 50 MCG/ACT NA SUSP: 2.0000 | Freq: Every day | NASAL | 4 refills | Status: DC

## 2017-08-01 MED FILL — MOMETASONE FUROATE 50 MCG S: 50 | 90 days supply | Qty: 51 | Fill #0

## 2017-10-17 ENCOUNTER — Encounter: Payer: Self-pay | Admitting: Physician Assistant

## 2017-10-17 ENCOUNTER — Ambulatory Visit (INDEPENDENT_AMBULATORY_CARE_PROVIDER_SITE_OTHER): Payer: No Typology Code available for payment source | Admitting: Physician Assistant

## 2017-10-17 VITALS — BP 127/81 | HR 69 | Temp 97.6°F | Ht 63.0 in | Wt 139.4 lb

## 2017-10-17 DIAGNOSIS — L309 Dermatitis, unspecified: Secondary | ICD-10-CM | POA: Diagnosis not present

## 2017-10-17 DIAGNOSIS — L700 Acne vulgaris: Secondary | ICD-10-CM | POA: Diagnosis not present

## 2017-10-17 MED ORDER — SPIRONOLACTONE 25 MG PO TABS: 25.0000 mg | ORAL_TABLET | Freq: Every day | ORAL | 3 refills | Status: DC

## 2017-10-17 MED ORDER — TRIAMCINOLONE ACETONIDE 0.5 % EX CREA: 1.0000 "application " | TOPICAL_CREAM | Freq: Three times a day (TID) | CUTANEOUS | 11 refills | Status: DC

## 2017-10-21 NOTE — Progress Notes (Signed)
BP 127/81   Pulse 69   Temp 97.6 F (36.4 C) (Oral)   Ht '5\' 3"'$  (1.6 m)   Wt 139 lb 6.4 oz (63.2 kg)   BMI 24.69 kg/m    Subjective:    Patient ID: Meredith Hall, female    DOB: 26-Jan-1990, 27 y.o.   MRN: [id number]  HPI: Meredith Hall is a 28 y.o. female presenting on 10/17/2017 for Acne and Eczema  This patient comes in for ongoing problems with cystic acne and eczema.  She has had eczema lifelong.  She is just having more significant flare in different areas of her body.  On her hands the most problem at this time.  The prescription cream was given has not helped very much.  She also has been trying different things for her acne without much resolution.  She has tried doxycycline without much relief leaf.  We have discussed the possibility of using Spironolactone to try and see if that will help hormonal cystic acne.  We will plan to do that this month and see how  It does  Past Medical History:  Diagnosis Date  . Eczema    Relevant past medical, surgical, family and social history reviewed and updated as indicated. Interim medical history since our last visit reviewed. Allergies and medications reviewed and updated. DATA REVIEWED: CHART IN EPIC  Family History reviewed for pertinent findings.  Review of Systems  Constitutional: Negative.  Negative for activity change, fatigue and fever.  HENT: Negative.   Eyes: Negative.   Respiratory: Negative.  Negative for cough.   Cardiovascular: Negative.  Negative for chest pain.  Gastrointestinal: Negative.  Negative for abdominal pain.  Endocrine: Negative.   Genitourinary: Negative.  Negative for dysuria.  Musculoskeletal: Negative.   Skin: Positive for color change, rash and wound.  Neurological: Negative.     Allergies as of 10/17/2017   No Known Allergies     Medication List        Accurate as of 10/17/17 11:59 PM. Always use your most recent med list.          cetirizine 10 MG tablet Commonly  known as:  ZYRTEC Take 1 tablet (10 mg total) by mouth daily.   desonide 0.05 % cream Commonly known as:  DESOWEN Apply topically 2 (two) times daily.   mometasone 50 MCG/ACT nasal spray Commonly known as:  NASONEX Place 2 sprays into the nose daily.   spironolactone 25 MG tablet Commonly known as:  ALDACTONE Take 1 tablet (25 mg total) by mouth daily.   triamcinolone cream 0.5 % Commonly known as:  KENALOG Apply 1 application topically 3 (three) times daily.          Objective:    BP 127/81   Pulse 69   Temp 97.6 F (36.4 C) (Oral)   Ht '5\' 3"'$  (1.6 m)   Wt 139 lb 6.4 oz (63.2 kg)   BMI 24.69 kg/m   No Known Allergies  Wt Readings from Last 3 Encounters:  10/17/17 139 lb 6.4 oz (63.2 kg)  06/14/17 146 lb 9.6 oz (66.5 kg)  05/02/17 152 lb (68.9 kg)    Physical Exam  Constitutional: She is oriented to person, place, and time. She appears well-developed and well-nourished.  HENT:  Head: Normocephalic and atraumatic.  Eyes: Pupils are equal, round, and reactive to light. Conjunctivae and EOM are normal.  Cardiovascular: Normal rate, regular rhythm, normal heart sounds and intact distal pulses.  Pulmonary/Chest: Effort normal  and breath sounds normal.  Abdominal: Soft. Bowel sounds are normal.  Neurological: She is alert and oriented to person, place, and time. She has normal reflexes.  Skin: Skin is warm and dry. No rash noted.  This patient has rolled patches on is consistent with eczema, some on her cheeks that are healing up.  None in the antecubital space at this time.  She does have lots of cystic acne in the.  Psychiatric: She has a normal mood and affect. Her behavior is normal. Judgment and thought content normal.    Results for orders placed or performed in visit on 06/15/17  TSH  Result Value Ref Range   TSH 3.510 0.450 - 4.500 uIU/mL  Lipid panel  Result Value Ref Range   Cholesterol, Total 123 100 - 199 mg/dL   Triglycerides 40 0 - 149 mg/dL   HDL  39 (L) >39 mg/dL   VLDL Cholesterol Cal 8 5 - 40 mg/dL   LDL Calculated 76 0 - 99 mg/dL   Chol/HDL Ratio 3.2 0.0 - 4.4 ratio  CMP14+EGFR  Result Value Ref Range   Glucose 81 65 - 99 mg/dL   BUN 11 6 - 20 mg/dL   Creatinine, Ser 0.68 0.57 - 1.00 mg/dL   GFR calc non Af Amer 120 >59 mL/min/1.73   GFR calc Af Amer 139 >59 mL/min/1.73   BUN/Creatinine Ratio 16 9 - 23   Sodium 140 134 - 144 mmol/L   Potassium 4.1 3.5 - 5.2 mmol/L   Chloride 104 96 - 106 mmol/L   CO2 21 20 - 29 mmol/L   Calcium 9.5 8.7 - 10.2 mg/dL   Total Protein 7.2 6.0 - 8.5 g/dL   Albumin 4.7 3.5 - 5.5 g/dL   Globulin, Total 2.5 1.5 - 4.5 g/dL   Albumin/Globulin Ratio 1.9 1.2 - 2.2   Bilirubin Total 0.6 0.0 - 1.2 mg/dL   Alkaline Phosphatase 60 39 - 117 IU/L   AST 12 0 - 40 IU/L   ALT 13 0 - 32 IU/L  CBC with Differential/Platelet  Result Value Ref Range   WBC 5.5 3.4 - 10.8 x10E3/uL   RBC 5.01 3.77 - 5.28 x10E6/uL   Hemoglobin 14.2 11.1 - 15.9 g/dL   Hematocrit 42.8 34.0 - 46.6 %   MCV 85 79 - 97 fL   MCH 28.3 26.6 - 33.0 pg   MCHC 33.2 31.5 - 35.7 g/dL   RDW 13.3 12.3 - 15.4 %   Platelets 253 150 - 450 x10E3/uL   Neutrophils 61 Not Estab. %   Lymphs 26 Not Estab. %   Monocytes 9 Not Estab. %   Eos 3 Not Estab. %   Basos 1 Not Estab. %   Neutrophils Absolute 3.4 1.4 - 7.0 x10E3/uL   Lymphocytes Absolute 1.4 0.7 - 3.1 x10E3/uL   Monocytes Absolute 0.5 0.1 - 0.9 x10E3/uL   EOS (ABSOLUTE) 0.2 0.0 - 0.4 x10E3/uL   Basophils Absolute 0.0 0.0 - 0.2 x10E3/uL   Immature Granulocytes 0 Not Estab. %   Immature Grans (Abs) 0.0 0.0 - 0.1 x10E3/uL      Assessment & Plan:   1. Cystic acne - spironolactone (ALDACTONE) 25 MG tablet; Take 1 tablet (25 mg total) by mouth daily.  Dispense: 90 tablet; Refill: 3  2. Eczema, unspecified type - triamcinolone cream (KENALOG) 0.5 %; Apply 1 application topically 3 (three) times daily.  Dispense: 30 g; Refill: 11   Continue all other maintenance medications as listed  above.  Follow  up plan: No follow-ups on file.  Educational handout given for Hunter PA-C [place] [address]  [place], [place] [address] [phone]   10/21/2017, 8:40 PM

## 2017-10-30 ENCOUNTER — Other Ambulatory Visit: Payer: Self-pay

## 2017-10-30 MED ORDER — DESONIDE 0.05 % EX CREA: TOPICAL_CREAM | Freq: Two times a day (BID) | CUTANEOUS | 0 refills | Status: DC

## 2018-01-22 ENCOUNTER — Ambulatory Visit: Payer: No Typology Code available for payment source | Admitting: Family

## 2018-01-28 ENCOUNTER — Other Ambulatory Visit: Payer: Self-pay

## 2018-01-28 MED ORDER — FLUCONAZOLE 150 MG PO TABS: 150.0000 mg | ORAL_TABLET | Freq: Once | ORAL | 0 refills | Status: DC | PRN

## 2018-01-28 NOTE — Progress Notes (Signed)
Patient c/o vag itching. Okay per MMM to send rx. Patient aware

## 2018-01-31 ENCOUNTER — Ambulatory Visit (INDEPENDENT_AMBULATORY_CARE_PROVIDER_SITE_OTHER): Payer: No Typology Code available for payment source | Admitting: Family

## 2018-01-31 ENCOUNTER — Encounter: Payer: Self-pay | Admitting: Family

## 2018-01-31 VITALS — BP 122/80 | HR 84 | Temp 97.7°F | Ht 63.0 in | Wt 139.0 lb

## 2018-01-31 DIAGNOSIS — T7840XA Allergy, unspecified, initial encounter: Secondary | ICD-10-CM | POA: Diagnosis not present

## 2018-01-31 DIAGNOSIS — Z91013 Allergy to seafood: Secondary | ICD-10-CM | POA: Diagnosis not present

## 2018-01-31 MED ORDER — METHYLPREDNISOLONE ACETATE 80 MG/ML IJ SUSP: 80.0000 mg | Freq: Once | INTRAMUSCULAR | Status: DC

## 2018-01-31 MED ORDER — EPINEPHRINE 0.3 MG/0.3ML IJ SOAJ: 0.3000 mg | Freq: Once | INTRAMUSCULAR | 1 refills | Status: AC

## 2018-01-31 NOTE — Progress Notes (Signed)
   Subjective:    Patient ID: Meredith Hall, female    DOB: 02-24-1990, 28 y.o.   MRN: [id number]  Chief Complaint  Patient presents with  . allergic reaction to shrimp    Urticaria  This is a new problem. The current episode started today. The problem has been gradually worsening since onset. The affected locations include the face (chest). The rash is characterized by redness and itchiness. She was exposed to shellfish. Pertinent negatives include no congestion, cough, Hall pain, fever, nail changes, shortness of breath or sore throat. The treatment provided no relief.      Review of Systems  Constitutional: Negative for fever.  HENT: Negative for congestion and sore throat.   Eyes: Negative for pain.  Respiratory: Negative for cough and shortness of breath.   Skin: Negative for nail changes.  All other systems reviewed and are negative.      Objective:   Physical Exam Vitals signs reviewed.  Constitutional:      General: She is not in acute distress.    Appearance: She is well-developed.  HENT:     Head: Normocephalic and atraumatic.  Eyes:     Pupils: Pupils are equal, round, and reactive to light.  Neck:     Musculoskeletal: Normal range of motion and neck supple.     Thyroid: No thyromegaly.  Cardiovascular:     Rate and Rhythm: Normal rate and regular rhythm.     Heart sounds: Normal heart sounds. No murmur.  Pulmonary:     Effort: Pulmonary effort is normal. No respiratory distress.     Breath sounds: Normal breath sounds. No wheezing.  Abdominal:     General: Bowel sounds are normal. There is no distension.     Palpations: Abdomen is soft.     Tenderness: There is no abdominal tenderness.  Musculoskeletal: Normal range of motion.        General: No tenderness.  Skin:    General: Skin is warm and dry.     Findings: Rash present. Rash is urticarial.       Neurological:     Mental Status: She is alert and oriented to person, place, and time.   Cranial Nerves: No cranial nerve deficit.     Deep Tendon Reflexes: Reflexes are normal and symmetric.  Psychiatric:        Behavior: Behavior normal.        Thought Content: Thought content normal.        Judgment: Judgment normal.       BP 122/80   Pulse 84   Temp 97.7 F (36.5 C) (Oral)   Ht 5\' 3"  (1.6 m)   Wt 139 lb (63 kg)   BMI 24.62 kg/m      Assessment & Plan:  [name] comes in today with chief complaint of allergic reaction to shrimp   Diagnosis and orders addressed:  1. Allergic reaction, initial encounter - methylPREDNISolone acetate (DEPO-MEDROL) injection 80 mg - EPINEPHrine (EPIPEN 2-PAK) 0.3 mg/0.3 mL IJ SOAJ injection; Inject 0.3 mLs (0.3 mg total) into the muscle once for 1 dose.  Dispense: 1 Device; Refill: 1  2. Shellfish allergy - EPINEPHrine (EPIPEN 2-PAK) 0.3 mg/0.3 mL IJ SOAJ injection; Inject 0.3 mLs (0.3 mg total) into the muscle once for 1 dose.  Dispense: 1 Device; Refill: 1   Avoid all Shellfish!! Epipen given Take Benadryl  RTO as needed  Evelina Dun, FNP

## 2018-01-31 NOTE — Patient Instructions (Signed)
Seafood Allergy A seafood allergy is an abnormal reaction to fish or shellfish by the body's defense system (immune system). If you are allergic to seafood, your body reacts to fish or shellfish as if it is a dangerous substance. In some cases, a seafood allergy can cause a severe, life-threatening reaction that may make it hard to breathe (anaphylaxis). A shellfish allergy is one of the most common types of allergy. Shellfish includes crab, lobster, and shrimp. A shellfish allergy often does not start until the person is an adult. It is less common to have an allergy to finned fish, such as tuna, halibut, or salmon. Often, a fish allergy also does not start until the person is an adult. What are the causes? A seafood allergy happens when the immune system sees fish or shellfish as harmful and releases chemicals (antibodies) to fight it. What are the signs or symptoms? Symptoms of this condition include:  Itching or tingling in your mouth.  Coughing.  Nasal congestion.  Sneezing.  Nausea and vomiting.  Diarrhea.  Headaches. In people with a severe allergy, a life-threatening reaction can occur called anaphylaxis. Get help right away if you have symptoms of anaphylaxis, such as:  Feeling warm in the face (flushed). This may include redness.  Itchy, red, swollen areas of skin (hives).  Swelling of the eyes, lips, face, mouth, tongue, or throat.  Difficulty breathing, speaking, or swallowing.  Noisy breathing (wheezing).  Dizziness or light-headedness.  Fainting.  Pain or cramping in the abdomen. How is this diagnosed? This condition may be diagnosed based on:  A physical exam.  Your medical history.  Blood tests.  A skin prick test. For this test, a small amount of a liquid containing an allergy-causing substance is put on your arm.  A food challenge test. This test involves eating the food that may be causing the allergic response while being monitored for a reaction  by your health care provider. How is this treated?  There is no cure for a seafood allergy. Treatment focuses on preventing exposure to the fish or shellfish you are allergic to and treating reactions if you are exposed to the food. Mild symptoms may not need treatment. Severe reactions usually need to be treated at a hospital. Treatment may include:  Medicines that help: ? Tighten your blood vessels (epinephrine). ? Relieve itching and hives (antihistamines). ? Widen the narrow and tight airways (bronchodilators). ? Reduce swelling (corticosteroids).  Oxygen therapy to help you breathe.  IV fluids to keep you hydrated. After a severe reaction, you may be given rescue medicines, such as:  An anaphylaxis kit.  An epinephrine injection, commonly called an auto-injector "pen" (pre-filled automatic epinephrine injection device). Your health care provider may teach you how to use these if you are accidentally exposed to an allergen. Follow these instructions at home: Eating and drinking  Do not eat the shellfish or fish that you are allergic to.  Read food labels carefully. Fish and shellfish can be ingredients in sauces, broths, and other products.  When you eat out, let your server know that you have a seafood allergy. Ask how foods are prepared. General instructions  Take over-the-counter and prescription medicines only as told by your health care provider.  Wear a medical alert bracelet or necklace that describes your allergy.  Carry your anaphylaxis kit or an auto-injector pen with you at all times. Use them as told by your health care provider.  Make sure that you, your family members, and your employer   told by your health care provider.   Wear a medical alert bracelet or necklace that describes your allergy.   Carry your anaphylaxis kit or an auto-injector pen with you at all times. Use them as told by your health care provider.   Make sure that you, your family members, and your employer know:  ? The signs of anaphylaxis.  ? How to use an anaphylaxis kit.  ? How to use an auto-injector pen.   If you think that you are having an anaphylactic reaction, use your auto-injector pen or anaphylaxis kit.   Replace your auto-injector pen immediately after use in case  you have another reaction.   Get medical care after you use your auto-injector pen. This is important because you can have a delayed, life-threatening reaction after taking the medicine (rebound anaphylaxis).   Inform all health care providers that you have a seafood allergy.   Keep all follow-up visits as told by your health care provider. This is important.  Contact a health care provider if you:   Have symptoms that do not go away within 2 days.   Have symptoms that get worse.   Have new symptoms.  Get help right away if you have symptoms of anaphylaxis:   Flushed skin.   Hives.   Swelling of the eyes, lips, face, mouth, tongue, or throat.   Difficulty breathing, speaking, or swallowing.   Wheezing.   Dizziness or light-headedness.   Fainting.   Pain or cramping in the abdomen.  These symptoms may represent a serious problem that is an emergency. Do not wait to see if the symptoms will go away. Use your auto-injector pen or anaphylaxis kit as you have been told. Get medical help right away. Call your local emergency services (911 in the [place] Do not drive yourself to the hospital.  If you needed to use an auto-injector pen, you need more medical care even if the medicine seems to be helping. This is important because anaphylaxis may happen again within 72 hours.  Summary   A seafood allergy is when your body reacts to fish or shellfish as though they are dangerous substances.   In some cases, a fish or shellfish allergy can cause a severe, life-threatening reaction that may make it hard to breathe (anaphylaxis).   There is no cure for food allergies. Treatment focuses on preventing exposure to the food or foods you are allergic to and treating reactions if you are exposed to the food.   Wear a medical alert bracelet or necklace that describes your allergy.   Make sure you understand your emergency treatment plan and know how to use the auto-injector pen.  This information is not intended to  replace advice given to you by your health care provider. Make sure you discuss any questions you have with your health care provider.  Document Released: 06/24/2001 Document Revised: 01/18/2017 Document Reviewed: 01/18/2017  Elsevier Interactive Patient Education  2019 [employer]

## 2018-01-31 NOTE — Addendum Note (Signed)
Addended by: Shelbie Ammons on: 01/31/2018 03:25 PM   Modules accepted: Orders

## 2018-02-15 ENCOUNTER — Other Ambulatory Visit: Payer: Self-pay | Admitting: Family Medicine

## 2018-02-15 ENCOUNTER — Telehealth: Payer: Self-pay | Admitting: Family

## 2018-02-15 MED ORDER — OSELTAMIVIR PHOSPHATE 75 MG PO CAPS: 75.0000 mg | ORAL_CAPSULE | Freq: Every day | ORAL | 0 refills | Status: DC

## 2018-02-15 NOTE — Telephone Encounter (Signed)
Please review and advise.

## 2018-02-15 NOTE — Telephone Encounter (Signed)
I sent in the requested prescription 

## 2018-02-15 NOTE — Telephone Encounter (Signed)
Patient notified

## 2018-03-27 ENCOUNTER — Ambulatory Visit: Payer: No Typology Code available for payment source | Admitting: Family

## 2018-03-27 ENCOUNTER — Other Ambulatory Visit: Payer: Self-pay

## 2018-03-28 ENCOUNTER — Ambulatory Visit (INDEPENDENT_AMBULATORY_CARE_PROVIDER_SITE_OTHER): Payer: No Typology Code available for payment source | Admitting: Family

## 2018-03-28 ENCOUNTER — Encounter: Payer: Self-pay | Admitting: Family

## 2018-03-28 VITALS — BP 130/87 | HR 82 | Temp 98.1°F | Ht 63.0 in | Wt 143.0 lb

## 2018-03-28 DIAGNOSIS — J0110 Acute frontal sinusitis, unspecified: Secondary | ICD-10-CM | POA: Diagnosis not present

## 2018-03-28 DIAGNOSIS — J309 Allergic rhinitis, unspecified: Secondary | ICD-10-CM | POA: Diagnosis not present

## 2018-03-28 DIAGNOSIS — L700 Acne vulgaris: Secondary | ICD-10-CM

## 2018-03-28 MED ORDER — CETIRIZINE HCL 10 MG PO TABS: 10.0000 mg | ORAL_TABLET | Freq: Every day | ORAL | 11 refills | Status: DC

## 2018-03-28 MED ORDER — MOMETASONE FUROATE 50 MCG/ACT NA SUSP: 2.0000 | Freq: Every day | NASAL | 4 refills | Status: DC

## 2018-03-28 MED ORDER — AMOXICILLIN-POT CLAVULANATE 875-125 MG PO TABS: 1.0000 | ORAL_TABLET | Freq: Two times a day (BID) | ORAL | 0 refills | Status: AC

## 2018-03-28 MED ORDER — SPIRONOLACTONE 25 MG PO TABS: 25.0000 mg | ORAL_TABLET | Freq: Every day | ORAL | 3 refills | Status: DC

## 2018-03-28 NOTE — Progress Notes (Signed)
Subjective:    Patient ID: Meredith Hall, female    DOB: Aug 18, 1990, 28 y.o.   MRN: [id number]  Chief Complaint  Patient presents with  . feels like she can't take a deep breath  . ringing in her ears  . sinus pressure in left facial    Sinusitis  This is a new problem. The current episode started 1 to 4 weeks ago. The problem has been gradually worsening since onset. There has been no fever. Her pain is at a severity of 4/10. The pain is mild. Associated symptoms include congestion, coughing, ear pain, headaches, a hoarse voice, sinus pressure, sneezing and a sore throat. Past treatments include oral decongestants and spray decongestants. The treatment provided mild relief.      Review of Systems  HENT: Positive for congestion, ear pain, hoarse voice, sinus pressure, sneezing and sore throat.   Respiratory: Positive for cough.   Neurological: Positive for headaches.  All other systems reviewed and are negative.      Objective:   Physical Exam Vitals signs reviewed.  Constitutional:      General: She is not in acute distress.    Appearance: She is well-developed.  HENT:     Head: Normocephalic and atraumatic.     Nose: Mucosal edema and rhinorrhea present.     Right Sinus: Frontal sinus tenderness present.     Left Sinus: Frontal sinus tenderness present.     Mouth/Throat:     Dentition: Has dentures.     Pharynx: Posterior oropharyngeal erythema present.  Eyes:     Pupils: Pupils are equal, round, and reactive to light.  Neck:     Musculoskeletal: Normal range of motion and neck supple.     Thyroid: No thyromegaly.  Cardiovascular:     Rate and Rhythm: Normal rate and regular rhythm.     Heart sounds: Normal heart sounds. No murmur.  Pulmonary:     Effort: Pulmonary effort is normal. No respiratory distress.     Breath sounds: Normal breath sounds. No wheezing.  Abdominal:     General: Bowel sounds are normal. There is no distension.     Palpations:  Abdomen is soft.     Tenderness: There is no abdominal tenderness.  Musculoskeletal: Normal range of motion.        General: No tenderness.  Skin:    General: Skin is warm and dry.  Neurological:     Mental Status: She is alert and oriented to person, place, and time.     Cranial Nerves: No cranial nerve deficit.     Deep Tendon Reflexes: Reflexes are normal and symmetric.  Psychiatric:        Behavior: Behavior normal.        Thought Content: Thought content normal.        Judgment: Judgment normal.       BP 130/87   Pulse 82   Temp 98.1 F (36.7 C) (Oral)   Ht 5\' 3"  (1.6 m)   Wt 143 lb (64.9 kg)   BMI 25.33 kg/m      Assessment & Plan:  Meredith Hall comes in today with chief complaint of feels like she can't take a deep breath; ringing in her ears; and sinus pressure in left facial   Diagnosis and orders addressed:  1. Acute frontal sinusitis, recurrence not specified - Take meds as prescribed - Use a cool mist humidifier  -Use saline nose sprays frequently -Force fluids -For any cough or congestion  Use plain Mucinex- regular strength or max strength is fine -For fever or aces or pains- take tylenol or ibuprofen. -Throat lozenges if help -New toothbrush in 3 days  Start daily zyrtec and flonase RTO if symptoms worsen or do not improve  - amoxicillin-clavulanate (AUGMENTIN) 875-125 MG tablet; Take 1 tablet by mouth 2 (two) times daily for 10 days.  Dispense: 20 tablet; Refill: 0   Evelina Dun, FNP

## 2018-03-28 NOTE — Patient Instructions (Signed)
Sinusitis, Adult  Sinusitis is inflammation of your sinuses. Sinuses are hollow spaces in the bones around your face. Your sinuses are located:   Around your eyes.   In the middle of your forehead.   Behind your nose.   In your cheekbones.  Mucus normally drains out of your sinuses. When your nasal tissues become inflamed or swollen, mucus can become trapped or blocked. This allows bacteria, viruses, and fungi to grow, which leads to infection. Most infections of the sinuses are caused by a virus.  Sinusitis can develop quickly. It can last for up to 4 weeks (acute) or for more than 12 weeks (chronic). Sinusitis often develops after a cold.  What are the causes?  This condition is caused by anything that creates swelling in the sinuses or stops mucus from draining. This includes:   Allergies.   Asthma.   Infection from bacteria or viruses.   Deformities or blockages in your nose or sinuses.   Abnormal growths in the nose (nasal polyps).   Pollutants, such as chemicals or irritants in the air.   Infection from fungi (rare).  What increases the risk?  You are more likely to develop this condition if you:   Have a weak body defense system (immune system).   Do a lot of swimming or diving.   Overuse nasal sprays.   Smoke.  What are the signs or symptoms?  The main symptoms of this condition are pain and a feeling of pressure around the affected sinuses. Other symptoms include:   Stuffy nose or congestion.   Thick drainage from your nose.   Swelling and warmth over the affected sinuses.   Headache.   Upper toothache.   A cough that may get worse at night.   Extra mucus that collects in the throat or the back of the nose (postnasal drip).   Decreased sense of smell and taste.   Fatigue.   A fever.   Sore throat.   Bad breath.  How is this diagnosed?  This condition is diagnosed based on:   Your symptoms.   Your medical history.   A physical exam.   Tests to find out if your condition is  acute or chronic. This may include:  ? Checking your nose for nasal polyps.  ? Viewing your sinuses using a device that has a light (endoscope).  ? Testing for allergies or bacteria.  ? Imaging tests, such as an MRI or CT scan.  In rare cases, a bone biopsy may be done to rule out more serious types of fungal sinus disease.  How is this treated?  Treatment for sinusitis depends on the cause and whether your condition is chronic or acute.   If caused by a virus, your symptoms should go away on their own within 10 days. You may be given medicines to relieve symptoms. They include:  ? Medicines that shrink swollen nasal passages (topical intranasal decongestants).  ? Medicines that treat allergies (antihistamines).  ? A spray that eases inflammation of the nostrils (topical intranasal corticosteroids).  ? Rinses that help get rid of thick mucus in your nose (nasal saline washes).   If caused by bacteria, your health care provider may recommend waiting to see if your symptoms improve. Most bacterial infections will get better without antibiotic medicine. You may be given antibiotics if you have:  ? A severe infection.  ? A weak immune system.   If caused by narrow nasal passages or nasal polyps, you may need   to have surgery.  Follow these instructions at home:  Medicines   Take, use, or apply over-the-counter and prescription medicines only as told by your health care provider. These may include nasal sprays.   If you were prescribed an antibiotic medicine, take it as told by your health care provider. Do not stop taking the antibiotic even if you start to feel better.  Hydrate and humidify     Drink enough fluid to keep your urine pale yellow. Staying hydrated will help to thin your mucus.   Use a cool mist humidifier to keep the humidity level in your home above 50%.   Inhale steam for 10-15 minutes, 3-4 times a day, or as told by your health care provider. You can do this in the bathroom while a hot shower is  running.   Limit your exposure to cool or dry air.  Rest   Rest as much as possible.   Sleep with your head raised (elevated).   Make sure you get enough sleep each night.  General instructions     Apply a warm, moist washcloth to your face 3-4 times a day or as told by your health care provider. This will help with discomfort.   Wash your hands often with soap and water to reduce your exposure to germs. If soap and water are not available, use hand sanitizer.   Do not smoke. Avoid being around people who are smoking (secondhand smoke).   Keep all follow-up visits as told by your health care provider. This is important.  Contact a health care provider if:   You have a fever.   Your symptoms get worse.   Your symptoms do not improve within 10 days.  Get help right away if:   You have a severe headache.   You have persistent vomiting.   You have severe pain or swelling around your face or eyes.   You have vision problems.   You develop confusion.   Your neck is stiff.   You have trouble breathing.  Summary   Sinusitis is soreness and inflammation of your sinuses. Sinuses are hollow spaces in the bones around your face.   This condition is caused by nasal tissues that become inflamed or swollen. The swelling traps or blocks the flow of mucus. This allows bacteria, viruses, and fungi to grow, which leads to infection.   If you were prescribed an antibiotic medicine, take it as told by your health care provider. Do not stop taking the antibiotic even if you start to feel better.   Keep all follow-up visits as told by your health care provider. This is important.  This information is not intended to replace advice given to you by your health care provider. Make sure you discuss any questions you have with your health care provider.  Document Released: 01/02/2005 Document Revised: 06/04/2017 Document Reviewed: 06/04/2017  [employer]vier Interactive Patient Education  2019 [employer]

## 2018-04-01 ENCOUNTER — Telehealth: Payer: Self-pay | Admitting: Family

## 2018-04-01 MED ORDER — FLUCONAZOLE 150 MG PO TABS: 150.0000 mg | ORAL_TABLET | ORAL | 0 refills | Status: DC | PRN

## 2018-04-01 NOTE — Telephone Encounter (Signed)
Diflucan Prescription sent to pharmacy   

## 2018-04-01 NOTE — Telephone Encounter (Signed)
Patient is having symptoms of yeast infection since beginning antibiotic.  Can we send in Diflucan for her?

## 2018-04-01 NOTE — Telephone Encounter (Signed)
Patient aware.

## 2018-04-02 ENCOUNTER — Other Ambulatory Visit: Payer: Self-pay | Admitting: *Deleted

## 2018-04-02 DIAGNOSIS — L700 Acne vulgaris: Secondary | ICD-10-CM

## 2018-04-02 DIAGNOSIS — L309 Dermatitis, unspecified: Secondary | ICD-10-CM

## 2018-04-02 DIAGNOSIS — J309 Allergic rhinitis, unspecified: Secondary | ICD-10-CM

## 2018-04-02 MED ORDER — CETIRIZINE HCL 10 MG PO TABS: 10.0000 mg | ORAL_TABLET | Freq: Every day | ORAL | 3 refills | Status: DC

## 2018-04-02 MED ORDER — DESONIDE 0.05 % EX CREA: TOPICAL_CREAM | Freq: Two times a day (BID) | CUTANEOUS | 0 refills | Status: DC

## 2018-04-02 MED ORDER — MOMETASONE FUROATE 50 MCG/ACT NA SUSP: 2.0000 | Freq: Every day | NASAL | 4 refills | Status: DC

## 2018-04-02 MED ORDER — TRIAMCINOLONE ACETONIDE 0.5 % EX CREA: 1.0000 "application " | TOPICAL_CREAM | Freq: Three times a day (TID) | CUTANEOUS | 11 refills | Status: DC

## 2018-04-02 MED ORDER — SPIRONOLACTONE 25 MG PO TABS: 25.0000 mg | ORAL_TABLET | Freq: Every day | ORAL | 3 refills | Status: DC

## 2018-04-02 MED FILL — MOMETASONE FUROATE 50 MCG S: 50 | 90 days supply | Qty: 51 | Fill #0

## 2018-04-02 MED FILL — DESONIDE 0.05% CREAM: 0.05 | 7 days supply | Qty: 30 | Fill #0

## 2018-04-02 MED FILL — CETIRIZINE HCL 10 MG TABS: 10 | 90 days supply | Qty: 90 | Fill #0

## 2018-04-02 MED FILL — SPIRONOLACTONE 25 MG TABS: 25 | 90 days supply | Qty: 90 | Fill #0

## 2018-04-02 MED FILL — TRIAMCINOLONE ACETONIDE 0.5: 0.5 | 7 days supply | Qty: 30 | Fill #0

## 2018-04-09 ENCOUNTER — Telehealth: Payer: Self-pay | Admitting: Family

## 2018-04-11 MED ORDER — MONTELUKAST SODIUM 10 MG PO TABS: 10.0000 mg | ORAL_TABLET | Freq: Every day | ORAL | 3 refills | Status: DC

## 2018-04-11 NOTE — Telephone Encounter (Signed)
Aware of new medication.  She had not been taking medications daily but will start.

## 2018-04-11 NOTE — Telephone Encounter (Signed)
Is she taking flonase and zyrtec every day? I am going to send in Singulair Prescription sent to her pharmacy. She will take this with her other medications.

## 2018-06-17 MED FILL — MONTELUKAST SOD 10 MG TAB: 10 | 30 days supply | Qty: 30 | Fill #0

## 2018-07-17 ENCOUNTER — Other Ambulatory Visit: Payer: Self-pay

## 2018-07-17 ENCOUNTER — Encounter: Payer: Self-pay | Admitting: Family

## 2018-07-17 ENCOUNTER — Ambulatory Visit (INDEPENDENT_AMBULATORY_CARE_PROVIDER_SITE_OTHER): Payer: No Typology Code available for payment source | Admitting: Family

## 2018-07-17 VITALS — BP 123/80 | HR 81 | Temp 98.4°F | Ht 63.0 in | Wt 140.0 lb

## 2018-07-17 DIAGNOSIS — R5383 Other fatigue: Secondary | ICD-10-CM | POA: Diagnosis not present

## 2018-07-17 DIAGNOSIS — B373 Candidiasis of vulva and vagina: Secondary | ICD-10-CM | POA: Diagnosis not present

## 2018-07-17 DIAGNOSIS — L700 Acne vulgaris: Secondary | ICD-10-CM

## 2018-07-17 DIAGNOSIS — B3731 Acute candidiasis of vulva and vagina: Secondary | ICD-10-CM

## 2018-07-17 MED ORDER — FLUCONAZOLE 150 MG PO TABS: 150.0000 mg | ORAL_TABLET | ORAL | 0 refills | Status: DC | PRN

## 2018-07-17 MED ORDER — TRETINOIN 0.1 % EX CREA: TOPICAL_CREAM | Freq: Every day | CUTANEOUS | 0 refills | Status: DC

## 2018-07-17 MED FILL — FLUCONAZOLE 150 MG TABS: 150 | 9 days supply | Qty: 3 | Fill #0

## 2018-07-17 NOTE — Patient Instructions (Signed)

## 2018-07-17 NOTE — Progress Notes (Signed)
Subjective:    Patient ID: Meredith Hall, female    DOB: [birth year], 28 y.o.   MRN: [id number]  Chief Complaint  Patient presents with  . Fatigue  . discuss cream for face   PT states she is having fatigue that she noticed two months ago. She denies any changes in her diet. She reports having her menses 4-5 days with the first 2 days are heavier bleeding with clots. She reports exercising 2 times a week for a hour. She does report she has had increase in her stress with COVID and work related.   She also requesting cream for her acne. She states she has had acne on and off for years. She reports since having to wear a mask at work she has noticed an increase on her chin and cheeks.  Vaginal Discharge The patient's primary symptoms include vaginal discharge. This is a new problem. The current episode started 1 to 4 weeks ago. The problem occurs intermittently. The problem has been waxing and waning.      Review of Systems  Genitourinary: Positive for vaginal discharge.  All other systems reviewed and are negative.      Objective:   Physical Exam Vitals signs reviewed.  Constitutional:      General: She is not in acute distress.    Appearance: She is well-developed.  HENT:     Head: Normocephalic and atraumatic.     Right Ear: Tympanic membrane normal.     Left Ear: Tympanic membrane normal.  Eyes:     Pupils: Pupils are equal, round, and reactive to light.  Neck:     Musculoskeletal: Normal range of motion and neck supple.     Thyroid: No thyromegaly.  Cardiovascular:     Rate and Rhythm: Normal rate and regular rhythm.     Heart sounds: Normal heart sounds. No murmur.  Pulmonary:     Effort: Pulmonary effort is normal. No respiratory distress.     Breath sounds: Normal breath sounds. No wheezing.  Abdominal:     General: Bowel sounds are normal. There is no distension.     Palpations: Abdomen is soft.     Tenderness: There is no abdominal tenderness.   Musculoskeletal: Normal range of motion.        General: No tenderness.  Skin:    General: Skin is warm and dry.  Neurological:     Mental Status: She is alert and oriented to person, place, and time.     Cranial Nerves: No cranial nerve deficit.     Deep Tendon Reflexes: Reflexes are normal and symmetric.  Psychiatric:        Behavior: Behavior normal.        Thought Content: Thought content normal.        Judgment: Judgment normal.       BP 123/80   Pulse 81   Temp 98.4 F (36.9 C) (Oral)   Ht _0  (1.6 m)   Wt 140 lb (63.5 kg)   BMI 24.80 kg/m      Assessment & Plan:  GESENIA BANTZ comes in today with chief complaint of Fatigue and discuss cream for face   Diagnosis and orders addressed:  1. Fatigue, unspecified type Encourage healthy diet and exercise - Anemia Profile B - TSH - VITAMIN D 25 Hydroxy (Vit-D Deficiency, Fractures) - BMP8+EGFR  2. Acne vulgaris Keep clean  - BMP8+EGFR - tretinoin (RETIN-A) 0.1 % cream; Apply topically at bedtime.  Dispense: 45 g;  Refill: 0  3. Vagina, candidiasis - BMP8+EGFR - fluconazole (DIFLUCAN) 150 MG tablet; Take 1 tablet (150 mg total) by mouth every three (3) days as needed.  Dispense: 3 tablet; Refill: 0   Labs pending Health Maintenance reviewed Diet and exercise encouraged  Follow up plan: As needed   Evelina Dun, FNP

## 2018-07-18 ENCOUNTER — Other Ambulatory Visit: Payer: Self-pay | Admitting: Family

## 2018-07-18 DIAGNOSIS — E559 Vitamin D deficiency, unspecified: Secondary | ICD-10-CM

## 2018-07-18 LAB — ANEMIA PROFILE B
Basophils Absolute: 0.1 10*3/uL (ref 0.0–0.2)
Basos: 1 %
EOS (ABSOLUTE): 0.2 10*3/uL (ref 0.0–0.4)
Eos: 3 %
Ferritin: 38 ng/mL (ref 15–150)
Folate: 4.3 ng/mL (ref >3.0)
Hematocrit: 42.7 % (ref 34.0–46.6)
Hemoglobin: 14.1 g/dL (ref 11.1–15.9)
Immature Grans (Abs): 0 10*3/uL (ref 0.0–0.1)
Immature Granulocytes: 0 %
Iron Saturation: 17 % (ref 15–55)
Iron: 56 ug/dL (ref 27–159)
Lymphocytes Absolute: 2.3 10*3/uL (ref 0.7–3.1)
Lymphs: 34 %
MCH: 28.5 pg (ref 26.6–33.0)
MCHC: 33 g/dL (ref 31.5–35.7)
MCV: 86 fL (ref 79–97)
Monocytes Absolute: 0.5 10*3/uL (ref 0.1–0.9)
Monocytes: 7 %
Neutrophils Absolute: 3.8 10*3/uL (ref 1.4–7.0)
Neutrophils: 55 %
Platelets: 255 10*3/uL (ref 150–450)
RBC: 4.95 x10E6/uL (ref 3.77–5.28)
RDW: 12.4 % (ref 11.7–15.4)
Retic Ct Pct: 1.3 % (ref 0.6–2.6)
Total Iron Binding Capacity: 339 ug/dL (ref 250–450)
UIBC: 283 ug/dL (ref 131–425)
Vitamin B-12: 386 pg/mL (ref 232–1245)
WBC: 6.8 10*3/uL (ref 3.4–10.8)

## 2018-07-18 LAB — BMP8+EGFR
BUN/Creatinine Ratio: 12 (ref 9–23)
BUN: 8 mg/dL (ref 6–20)
CO2: 22 mmol/L (ref 20–29)
Calcium: 9.4 mg/dL (ref 8.7–10.2)
Chloride: 103 mmol/L (ref 96–106)
Creatinine, Ser: 0.66 mg/dL (ref 0.57–1.00)
GFR calc Af Amer: 139 mL/min/{1.73_m2} (ref >59)
GFR calc non Af Amer: 121 mL/min/{1.73_m2} (ref >59)
Glucose: 101 mg/dL — ABNORMAL HIGH (ref 65–99)
Potassium: 3.7 mmol/L (ref 3.5–5.2)
Sodium: 139 mmol/L (ref 134–144)

## 2018-07-18 LAB — TSH: TSH: 2.19 u[IU]/mL (ref 0.450–4.500)

## 2018-07-18 LAB — VITAMIN D 25 HYDROXY (VIT D DEFICIENCY, FRACTURES): Vit D, 25-Hydroxy: 12.7 ng/mL — ABNORMAL LOW (ref 30.0–100.0)

## 2018-07-18 MED ORDER — VITAMIN D (ERGOCALCIFEROL) 1.25 MG (50000 UNIT) PO CAPS: 50000.0000 [IU] | ORAL_CAPSULE | ORAL | 3 refills | Status: DC

## 2018-07-18 MED FILL — VIT D2 1.25 MG (50,000 UNIT: 1.25 MG | 84 days supply | Qty: 12 | Fill #0

## 2018-07-18 MED FILL — MONTELUKAST SOD 10 MG TAB: 10 | 30 days supply | Qty: 30 | Fill #1

## 2018-10-16 ENCOUNTER — Other Ambulatory Visit: Payer: Self-pay

## 2018-10-16 MED ORDER — MONTELUKAST SODIUM 10 MG PO TABS: 10.0000 mg | ORAL_TABLET | Freq: Every day | ORAL | 1 refills | Status: DC

## 2018-10-16 MED FILL — VIT D2 1.25 MG (50,000 UNIT: 1.25 MG | 84 days supply | Qty: 12 | Fill #1

## 2018-10-16 MED FILL — MOMETASONE FUROATE 50 MCG S: 50 | 90 days supply | Qty: 51 | Fill #1

## 2018-10-16 MED FILL — MONTELUKAST SOD 10 MG TAB: 10 | 90 days supply | Qty: 90 | Fill #0

## 2018-10-16 MED FILL — TRIAMCINOLONE ACETONIDE 0.5: 0.5 | 7 days supply | Qty: 30 | Fill #1

## 2019-01-22 MED FILL — VIT D2 1.25 MG (50,000 UNIT: 1.25 MG | 84 days supply | Qty: 12 | Fill #2

## 2019-01-22 MED FILL — MOMETASONE FUROATE 50 MCG S: 50 | 90 days supply | Qty: 51 | Fill #2

## 2019-02-13 ENCOUNTER — Telehealth: Payer: Self-pay | Admitting: Family

## 2019-02-13 DIAGNOSIS — L709 Acne, unspecified: Secondary | ICD-10-CM

## 2019-02-13 MED ORDER — DOXYCYCLINE HYCLATE 100 MG PO TABS: 100.0000 mg | ORAL_TABLET | Freq: Two times a day (BID) | ORAL | 1 refills | Status: DC

## 2019-02-13 MED FILL — DOXYCYCLINE HYCLATE 100 MG: 100 | 90 days supply | Qty: 180 | Fill #0

## 2019-02-13 NOTE — Telephone Encounter (Signed)
What is the name of the medication? doxycycline (VIBRA-TABS) 100 MG tablet [id number]   Have you contacted your pharmacy to request a refill? Pt was previously on this wants a refill because of breakout from mask  Which pharmacy would you like this sent to? [facility] day supply   Patient notified that their request is being sent to the clinical staff for review and that they should receive a call once it is complete. If they do not receive a call within 24 hours they can check with their pharmacy or our office.

## 2019-02-13 NOTE — Telephone Encounter (Signed)
Prescription sent to pharmacy.

## 2019-04-18 MED FILL — MONTELUKAST SOD 10 MG TAB: 10 | 90 days supply | Qty: 90 | Fill #1

## 2019-04-25 ENCOUNTER — Other Ambulatory Visit: Payer: Self-pay | Admitting: Family Medicine

## 2019-04-25 DIAGNOSIS — E559 Vitamin D deficiency, unspecified: Secondary | ICD-10-CM

## 2019-04-25 DIAGNOSIS — Z13220 Encounter for screening for lipoid disorders: Secondary | ICD-10-CM

## 2019-04-25 DIAGNOSIS — Z0000 Encounter for general adult medical examination without abnormal findings: Secondary | ICD-10-CM

## 2019-04-25 DIAGNOSIS — Z130 Encounter for screening for diseases of the blood and blood-forming organs and certain disorders involving the immune mechanism: Secondary | ICD-10-CM

## 2019-04-25 DIAGNOSIS — Z1329 Encounter for screening for other suspected endocrine disorder: Secondary | ICD-10-CM

## 2019-04-28 ENCOUNTER — Other Ambulatory Visit: Payer: No Typology Code available for payment source

## 2019-04-28 DIAGNOSIS — E559 Vitamin D deficiency, unspecified: Secondary | ICD-10-CM

## 2019-04-28 DIAGNOSIS — Z130 Encounter for screening for diseases of the blood and blood-forming organs and certain disorders involving the immune mechanism: Secondary | ICD-10-CM

## 2019-04-28 DIAGNOSIS — Z1329 Encounter for screening for other suspected endocrine disorder: Secondary | ICD-10-CM

## 2019-04-28 DIAGNOSIS — Z0000 Encounter for general adult medical examination without abnormal findings: Secondary | ICD-10-CM

## 2019-04-28 DIAGNOSIS — Z13220 Encounter for screening for lipoid disorders: Secondary | ICD-10-CM

## 2019-04-29 ENCOUNTER — Ambulatory Visit: Payer: No Typology Code available for payment source | Admitting: Family Medicine

## 2019-04-29 ENCOUNTER — Ambulatory Visit (INDEPENDENT_AMBULATORY_CARE_PROVIDER_SITE_OTHER): Payer: No Typology Code available for payment source | Admitting: Family Medicine

## 2019-04-29 ENCOUNTER — Encounter: Payer: Self-pay | Admitting: Family Medicine

## 2019-04-29 VITALS — BP 126/74 | HR 86 | Temp 97.8°F | Resp 16 | Ht 64.0 in | Wt 150.0 lb

## 2019-04-29 DIAGNOSIS — E663 Overweight: Secondary | ICD-10-CM | POA: Insufficient documentation

## 2019-04-29 DIAGNOSIS — Z0000 Encounter for general adult medical examination without abnormal findings: Secondary | ICD-10-CM

## 2019-04-29 DIAGNOSIS — L700 Acne vulgaris: Secondary | ICD-10-CM | POA: Diagnosis not present

## 2019-04-29 DIAGNOSIS — Z6825 Body mass index (BMI) 25.0-25.9, adult: Secondary | ICD-10-CM | POA: Diagnosis not present

## 2019-04-29 DIAGNOSIS — K581 Irritable bowel syndrome with constipation: Secondary | ICD-10-CM | POA: Insufficient documentation

## 2019-04-29 DIAGNOSIS — E559 Vitamin D deficiency, unspecified: Secondary | ICD-10-CM

## 2019-04-29 LAB — LIPID PANEL
Chol/HDL Ratio: 3.3 ratio (ref 0.0–4.4)
Cholesterol, Total: 142 mg/dL (ref 100–199)
HDL: 43 mg/dL (ref >39)
LDL Chol Calc (NIH): 90 mg/dL (ref 0–99)
Triglycerides: 37 mg/dL (ref 0–149)
VLDL Cholesterol Cal: 9 mg/dL (ref 5–40)

## 2019-04-29 LAB — CBC WITH DIFFERENTIAL/PLATELET
Basophils Absolute: 0.1 10*3/uL (ref 0.0–0.2)
Basos: 1 %
EOS (ABSOLUTE): 0.2 10*3/uL (ref 0.0–0.4)
Eos: 3 %
Hematocrit: 41.5 % (ref 34.0–46.6)
Hemoglobin: 14.2 g/dL (ref 11.1–15.9)
Immature Grans (Abs): 0 10*3/uL (ref 0.0–0.1)
Immature Granulocytes: 0 %
Lymphocytes Absolute: 2 10*3/uL (ref 0.7–3.1)
Lymphs: 36 %
MCH: 29 pg (ref 26.6–33.0)
MCHC: 34.2 g/dL (ref 31.5–35.7)
MCV: 85 fL (ref 79–97)
Monocytes Absolute: 0.4 10*3/uL (ref 0.1–0.9)
Monocytes: 7 %
Neutrophils Absolute: 2.8 10*3/uL (ref 1.4–7.0)
Neutrophils: 53 %
Platelets: 262 10*3/uL (ref 150–450)
RBC: 4.89 x10E6/uL (ref 3.77–5.28)
RDW: 12 % (ref 11.7–15.4)
WBC: 5.4 10*3/uL (ref 3.4–10.8)

## 2019-04-29 LAB — CMP14+EGFR
ALT: 10 IU/L (ref 0–32)
AST: 13 IU/L (ref 0–40)
Albumin/Globulin Ratio: 2 (ref 1.2–2.2)
Albumin: 4.6 g/dL (ref 3.9–5.0)
Alkaline Phosphatase: 71 IU/L (ref 39–117)
BUN/Creatinine Ratio: 22 (ref 9–23)
BUN: 13 mg/dL (ref 6–20)
Bilirubin Total: 0.2 mg/dL (ref 0.0–1.2)
CO2: 20 mmol/L (ref 20–29)
Calcium: 9.5 mg/dL (ref 8.7–10.2)
Chloride: 104 mmol/L (ref 96–106)
Creatinine, Ser: 0.59 mg/dL (ref 0.57–1.00)
GFR calc Af Amer: 143 mL/min/{1.73_m2} (ref >59)
GFR calc non Af Amer: 124 mL/min/{1.73_m2} (ref >59)
Globulin, Total: 2.3 g/dL (ref 1.5–4.5)
Glucose: 88 mg/dL (ref 65–99)
Potassium: 4.6 mmol/L (ref 3.5–5.2)
Sodium: 142 mmol/L (ref 134–144)
Total Protein: 6.9 g/dL (ref 6.0–8.5)

## 2019-04-29 LAB — THYROID PANEL WITH TSH
Free Thyroxine Index: 2.5 (ref 1.2–4.9)
T3 Uptake Ratio: 27 % (ref 24–39)
T4, Total: 9.1 ug/dL (ref 4.5–12.0)
TSH: 2.45 u[IU]/mL (ref 0.450–4.500)

## 2019-04-29 LAB — VITAMIN D 25 HYDROXY (VIT D DEFICIENCY, FRACTURES): Vit D, 25-Hydroxy: 18.8 ng/mL — ABNORMAL LOW (ref 30.0–100.0)

## 2019-04-29 MED ORDER — CLINDAMYCIN PHOSPHATE 1 % EX GEL: Freq: Two times a day (BID) | CUTANEOUS | 11 refills | Status: DC

## 2019-04-29 MED ORDER — LUBIPROSTONE 8 MCG PO CAPS: 8.0000 ug | ORAL_CAPSULE | Freq: Two times a day (BID) | ORAL | 11 refills | Status: AC

## 2019-04-29 NOTE — Patient Instructions (Addendum)
My Fitness Pal   Acne Plan  Products: Face Wash:  Use a gentle cleanser, such as Cetaphil (generic version of this is fine) Moisturizer:  Use an "oil-free" moisturizer with SPF Prescription Cream(s):  clindamycin at bedtime  Morning: Wash face, then completely dry Apply Moisturizer to entire face  Bedtime: Wash face, then completely dry Apply clindamycin, pea size amount that you massage into problem areas on the face.  Remember: - Your acne will probably get worse before it gets better - It takes at least 2 months for the medicines to start working - Use oil free soaps and lotions; these can be over the counter or store-brand - Don't use harsh scrubs or astringents, these can make skin irritation and acne worse - Moisturize daily with oil free lotion because the acne medicines will dry your skin  Call your doctor if you have: - Lots of skin dryness or redness that doesn't get better if you use a moisturizer or if you use the prescription cream or lotion every other day    Stop using the acne medicine immediately and see your doctor if you are or become pregnant or if you think you had an allergic reaction (itchy rash, difficulty breathing, nausea, vomiting) to your acne medication.

## 2019-04-29 NOTE — Progress Notes (Signed)
Subjective:  Patient ID: Meredith Hall, female    DOB: Nov 23, 1990, 29 y.o.   MRN: [id number]  Patient Care Team: Sharion Balloon, FNP as PCP - General (Family Medicine)   Chief Complaint:  Annual Exam   HPI: Meredith Hall is a 29 y.o. female presenting on 04/29/2019 for Annual Exam   Pt presents today for her annual physical exam. States she is doing ok overall. States she has gained some weight in the last few months. She attributes this to poor eating habits and lack of exercise. She also reports continued issues with her IBS. States she only has a bowel movement every 2-3 days and feels really bloated and has cramping other days. States she has tried Linzess in the past but this caused significant cramping and diarrhea so she stopped it. She reports she continues to have acne. Worse under her mask line. She does take doxycycline intermittently but feels this is not effective.    Relevant past medical, surgical, family, and social history reviewed and updated as indicated.  Allergies and medications reviewed and updated. Date reviewed: Chart in Epic.   Past Medical History:  Diagnosis Date  . Eczema     History reviewed. No pertinent surgical history.  Social History   Socioeconomic History  . Marital status: Single    Spouse name: Not on file  . Number of children: Not on file  . Years of education: Not on file  . Highest education level: Not on file  Occupational History  . Not on file  Tobacco Use  . Smoking status: Never Smoker  . Smokeless tobacco: Never Used  Substance and Sexual Activity  . Alcohol use: No  . Drug use: No  . Sexual activity: Not on file  Other Topics Concern  . Not on file  Social History Narrative  . Not on file   Social Determinants of Health   Financial Resource Strain:   . Difficulty of Paying Living Expenses:   Food Insecurity:   . Worried About Charity fundraiser in the Last Year:   . Arboriculturist in the Last  Year:   Transportation Needs:   . Film/video editor (Medical):   Marland Kitchen Lack of Transportation (Non-Medical):   Physical Activity:   . Days of Exercise per Week:   . Minutes of Exercise per Session:   Stress:   . Feeling of Stress :   Social Connections:   . Frequency of Communication with Friends and Family:   . Frequency of Social Gatherings with Friends and Family:   . Attends Religious Services:   . Active Member of Clubs or Organizations:   . Attends Archivist Meetings:   Marland Kitchen Marital Status:   Intimate Partner Violence:   . Fear of Current or Ex-Partner:   . Emotionally Abused:   Marland Kitchen Physically Abused:   . Sexually Abused:     Outpatient Encounter Medications as of 04/29/2019  Medication Sig  . cetirizine (ZYRTEC) 10 MG tablet Take 1 tablet (10 mg total) by mouth daily.  . clindamycin (CLINDAGEL) 1 % gel Apply topically 2 (two) times daily.  Marland Kitchen desonide (DESOWEN) 0.05 % cream Apply topically 2 (two) times daily.  Marland Kitchen doxycycline (VIBRA-TABS) 100 MG tablet Take 1 tablet (100 mg total) by mouth 2 (two) times daily.  . fluconazole (DIFLUCAN) 150 MG tablet Take 1 tablet (150 mg total) by mouth every three (3) days as needed.  . lubiprostone (AMITIZA) 8  MCG capsule Take 1 capsule (8 mcg total) by mouth 2 (two) times daily with a meal.  . mometasone (NASONEX) 50 MCG/ACT nasal spray Place 2 sprays into the nose daily.  . montelukast (SINGULAIR) 10 MG tablet Take 1 tablet (10 mg total) by mouth at bedtime.  . tretinoin (RETIN-A) 0.1 % cream Apply topically at bedtime.  . triamcinolone cream (KENALOG) 0.5 % Apply 1 application topically 3 (three) times daily.  . Vitamin D, Ergocalciferol, (DRISDOL) 1.25 MG (50000 UT) CAPS capsule Take 1 capsule (50,000 Units total) by mouth every 7 (seven) days.  . [DISCONTINUED] spironolactone (ALDACTONE) 25 MG tablet Take 1 tablet (25 mg total) by mouth daily. (Patient not taking: Reported on 07/17/2018)   No facility-administered encounter  medications on file as of 04/29/2019.    Allergies  Allergen Reactions  . Shrimp [Shellfish Allergy]     Red, itching rash    Review of Systems  Constitutional: Positive for unexpected weight change. Negative for activity change, appetite change, chills, diaphoresis, fatigue and fever.  HENT: Negative.  Negative for congestion.   Eyes: Negative.  Negative for photophobia and visual disturbance.  Respiratory: Negative for cough, chest tightness and shortness of breath.   Cardiovascular: Negative for chest pain, palpitations and leg swelling.  Gastrointestinal: Positive for abdominal distention, abdominal pain and constipation. Negative for anal bleeding, blood in stool, diarrhea, nausea and vomiting.  Endocrine: Negative.  Negative for cold intolerance, heat intolerance, polydipsia, polyphagia and polyuria.  Genitourinary: Negative for decreased urine volume, difficulty urinating, dysuria, frequency, menstrual problem and urgency.  Musculoskeletal: Negative for arthralgias, back pain and myalgias.  Skin:       acne  Allergic/Immunologic: Negative.   Neurological: Negative for dizziness, tremors, seizures, syncope, facial asymmetry, speech difficulty, weakness, light-headedness, numbness and headaches.  Hematological: Negative.   Psychiatric/Behavioral: Negative for confusion, hallucinations, sleep disturbance and suicidal ideas.  All other systems reviewed and are negative.       Objective:  BP 126/74   Pulse 86   Temp 97.8 F (36.6 C)   Resp 16   Ht '5\' 4"'$  (1.626 m)   Wt 150 lb (68 kg)   LMP 03/30/2019   SpO2 100%   BMI 25.75 kg/m    Wt Readings from Last 3 Encounters:  04/29/19 150 lb (68 kg)  07/17/18 140 lb (63.5 kg)  03/28/18 143 lb (64.9 kg)    Physical Exam Vitals and nursing note reviewed.  Constitutional:      General: She is not in acute distress.    Appearance: Normal appearance. She is well-developed and well-groomed. She is not ill-appearing,  toxic-appearing or diaphoretic.  HENT:     Head: Normocephalic and atraumatic.     Jaw: There is normal jaw occlusion.     Right Ear: Hearing, tympanic membrane, ear canal and external ear normal.     Left Ear: Hearing, tympanic membrane, ear canal and external ear normal.     Nose: Nose normal.     Mouth/Throat:     Lips: Pink.     Mouth: Mucous membranes are moist.     Pharynx: Oropharynx is clear. Uvula midline.  Eyes:     General: Lids are normal.     Extraocular Movements: Extraocular movements intact.     Conjunctiva/sclera: Conjunctivae normal.     Pupils: Pupils are equal, round, and reactive to light.  Neck:     Thyroid: No thyroid mass, thyromegaly or thyroid tenderness.     Vascular: No carotid bruit or  JVD.     Trachea: Trachea and phonation normal.  Cardiovascular:     Rate and Rhythm: Normal rate and regular rhythm.     Chest Wall: PMI is not displaced.     Pulses: Normal pulses.     Heart sounds: Normal heart sounds. No murmur. No friction rub. No gallop.   Pulmonary:     Effort: Pulmonary effort is normal. No respiratory distress.     Breath sounds: Normal breath sounds. No wheezing.  Abdominal:     General: Bowel sounds are normal. There is no distension or abdominal bruit.     Palpations: Abdomen is soft. There is no hepatomegaly or splenomegaly.     Tenderness: There is no abdominal tenderness. There is no right CVA tenderness or left CVA tenderness.     Hernia: No hernia is present.  Musculoskeletal:        General: Normal range of motion.     Cervical back: Normal range of motion and neck supple.     Right lower leg: No edema.     Left lower leg: No edema.  Lymphadenopathy:     Cervical: No cervical adenopathy.  Skin:    General: Skin is warm and dry.     Capillary Refill: Capillary refill takes less than 2 seconds.     Coloration: Skin is not cyanotic, jaundiced or pale.     Findings: Acne (cystic and vulgaris) present. No rash.  Neurological:      General: No focal deficit present.     Mental Status: She is alert and oriented to person, place, and time.     Cranial Nerves: Cranial nerves are intact. No cranial nerve deficit.     Sensory: Sensation is intact. No sensory deficit.     Motor: Motor function is intact. No weakness.     Coordination: Coordination is intact. Coordination normal.     Gait: Gait is intact. Gait normal.     Deep Tendon Reflexes: Reflexes are normal and symmetric. Reflexes normal.  Psychiatric:        Attention and Perception: Attention and perception normal.        Mood and Affect: Mood and affect normal.        Speech: Speech normal.        Behavior: Behavior normal. Behavior is cooperative.        Thought Content: Thought content normal.        Cognition and Memory: Cognition and memory normal.        Judgment: Judgment normal.      Labs reviewed in detail with pt during visit today.   Results for orders placed or performed in visit on 04/28/19  VITAMIN D 25 Hydroxy (Vit-D Deficiency, Fractures)  Result Value Ref Range   Vit D, 25-Hydroxy 18.8 (L) 30.0 - 100.0 ng/mL  Thyroid Panel With TSH  Result Value Ref Range   TSH 2.450 0.450 - 4.500 uIU/mL   T4, Total 9.1 4.5 - 12.0 ug/dL   T3 Uptake Ratio 27 24 - 39 %   Free Thyroxine Index 2.5 1.2 - 4.9  Lipid panel  Result Value Ref Range   Cholesterol, Total 142 100 - 199 mg/dL   Triglycerides 37 0 - 149 mg/dL   HDL 43 >39 mg/dL   VLDL Cholesterol Cal 9 5 - 40 mg/dL   LDL Chol Calc (NIH) 90 0 - 99 mg/dL   Chol/HDL Ratio 3.3 0.0 - 4.4 ratio  CMP14+EGFR  Result Value Ref Range  Glucose 88 65 - 99 mg/dL   BUN 13 6 - 20 mg/dL   Creatinine, Ser 0.59 0.57 - 1.00 mg/dL   GFR calc non Af Amer 124 >59 mL/min/1.73   GFR calc Af Amer 143 >59 mL/min/1.73   BUN/Creatinine Ratio 22 9 - 23   Sodium 142 134 - 144 mmol/L   Potassium 4.6 3.5 - 5.2 mmol/L   Chloride 104 96 - 106 mmol/L   CO2 20 20 - 29 mmol/L   Calcium 9.5 8.7 - 10.2 mg/dL   Total Protein  6.9 6.0 - 8.5 g/dL   Albumin 4.6 3.9 - 5.0 g/dL   Globulin, Total 2.3 1.5 - 4.5 g/dL   Albumin/Globulin Ratio 2.0 1.2 - 2.2   Bilirubin Total 0.2 0.0 - 1.2 mg/dL   Alkaline Phosphatase 71 39 - 117 IU/L   AST 13 0 - 40 IU/L   ALT 10 0 - 32 IU/L  CBC with Differential/Platelet  Result Value Ref Range   WBC 5.4 3.4 - 10.8 x10E3/uL   RBC 4.89 3.77 - 5.28 x10E6/uL   Hemoglobin 14.2 11.1 - 15.9 g/dL   Hematocrit 41.5 34.0 - 46.6 %   MCV 85 79 - 97 fL   MCH 29.0 26.6 - 33.0 pg   MCHC 34.2 31.5 - 35.7 g/dL   RDW 12.0 11.7 - 15.4 %   Platelets 262 150 - 450 x10E3/uL   Neutrophils 53 Not Estab. %   Lymphs 36 Not Estab. %   Monocytes 7 Not Estab. %   Eos 3 Not Estab. %   Basos 1 Not Estab. %   Neutrophils Absolute 2.8 1.4 - 7.0 x10E3/uL   Lymphocytes Absolute 2.0 0.7 - 3.1 x10E3/uL   Monocytes Absolute 0.4 0.1 - 0.9 x10E3/uL   EOS (ABSOLUTE) 0.2 0.0 - 0.4 x10E3/uL   Basophils Absolute 0.1 0.0 - 0.2 x10E3/uL   Immature Granulocytes 0 Not Estab. %   Immature Grans (Abs) 0.0 0.0 - 0.1 x10E3/uL       Pertinent labs & imaging results that were available during my care of the patient were reviewed by me and considered in my medical decision making.  Assessment & Plan:  Dajai was seen today for annual exam.  Diagnoses and all orders for this visit:  Annual physical exam Labs completed prior to visit and reviewed with pt during visit.  Health maintenance discussed in detail. Pt aware she needs to have PAP with GYN, will schedule.  Diet and exercise discussed.  BMI 25.0-25.9,adult Diet and exercise encouraged. Discussed My Fitness Pal app and how to use. Pt will try this to see if beneficial.   Cystic acne Face hygiene discussed in detail. Pt will trial below.  -     clindamycin (CLINDAGEL) 1 % gel; Apply topically 2 (two) times daily.  Irritable bowel syndrome with constipation Unable to tolerate Linzess due to cramping and diarrhea. Will Trial below. Pt aware to report any new,  worsening, or persistent symptoms.  -     lubiprostone (AMITIZA) 8 MCG capsule; Take 1 capsule (8 mcg total) by mouth 2 (two) times daily with a meal.  Vitamin D deficiency Labs completed. Continue repletion therapy. Eat foods rich in Vit D including milk, orange juice, yogurt with vitamin D added, salmon or mackerel, canned tuna fish, cereals with vitamin D added, and cod liver oil. Get out in the sun but make sure to wear at least SPF 30 sunscreen.     Continue all other maintenance medications.  Follow  up plan: Return in about 1 year (around 04/28/2020), or if symptoms worsen or fail to improve, for CPE.   Continue healthy lifestyle choices, including diet (rich in fruits, vegetables, and lean proteins, and low in salt and simple carbohydrates) and exercise (at least 30 minutes of moderate physical activity daily).  Educational handout given for acne  The above assessment and management plan was discussed with the patient. The patient verbalized understanding of and has agreed to the management plan. Patient is aware to call the clinic if they develop any new symptoms or if symptoms persist or worsen. Patient is aware when to return to the clinic for a follow-up visit. Patient educated on when it is appropriate to go to the emergency department.   Monia Pouch, FNP-C [facility] [phone]

## 2019-06-26 ENCOUNTER — Telehealth (INDEPENDENT_AMBULATORY_CARE_PROVIDER_SITE_OTHER): Payer: No Typology Code available for payment source | Admitting: Family

## 2019-06-26 ENCOUNTER — Encounter: Payer: Self-pay | Admitting: Family

## 2019-06-26 DIAGNOSIS — F411 Generalized anxiety disorder: Secondary | ICD-10-CM

## 2019-06-26 MED ORDER — ESCITALOPRAM OXALATE 5 MG PO TABS: ORAL_TABLET | ORAL | 0 refills | Status: DC

## 2019-06-26 MED ORDER — ESCITALOPRAM OXALATE 10 MG PO TABS: 10.0000 mg | ORAL_TABLET | Freq: Every day | ORAL | 3 refills | Status: DC

## 2019-06-26 NOTE — Progress Notes (Signed)
   Virtual Visit via telephone Note Due to COVID-19 pandemic this visit was conducted virtually. This visit type was conducted due to national recommendations for restrictions regarding the COVID-19 Pandemic (e.g. social distancing, sheltering in place) in an effort to limit this patient's exposure and mitigate transmission in our community. All issues noted in this document were discussed and addressed.  A physical exam was not performed with this format.  I connected with Meredith Hall on 06/26/19 at 1:40 pm by video and verified that I am speaking with the correct person using two identifiers. Meredith Hall is currently located at work and no one is currently with her during visit. The provider, Evelina Dun, FNP is located in their office at time of visit.  I discussed the limitations, risks, security and privacy concerns of performing an evaluation and management service by telephone and the availability of in person appointments. I also discussed with the patient that there may be a patient responsible charge related to this service. The patient expressed understanding and agreed to proceed.   History and Present Illness:  Anxiety Presents for initial visit. Onset was 1 to 5 years ago. The problem has been waxing and waning. Symptoms include decreased concentration, excessive worry, irritability, nervous/anxious behavior, palpitations and panic. Patient reports no insomnia or restlessness. Symptoms occur most days. The severity of symptoms is moderate.      Review of Systems  Constitutional: Positive for irritability.  Cardiovascular: Positive for palpitations.  Psychiatric/Behavioral: Positive for decreased concentration. The patient is nervous/anxious. The patient does not have insomnia.      Observations/Objective: No SOB or distress noted  Assessment and Plan: 1. GAD (generalized anxiety disorder) Will start Lexapro 5 mg today then increase to 10 mg  Stress  management  Will do referral for therapy RTO in 6 weeks to recheck  - Ambulatory referral to Psychiatry - escitalopram (LEXAPRO) 5 MG tablet; Take 1 tablet (5 mg total) by mouth daily for 14 days, THEN 2 tablets (10 mg total) daily for 14 days.  Dispense: 42 tablet; Refill: 0 - escitalopram (LEXAPRO) 10 MG tablet; Take 1 tablet (10 mg total) by mouth daily.  Dispense: 90 tablet; Refill: 3     I discussed the assessment and treatment plan with the patient. The patient was provided an opportunity to ask questions and all were answered. The patient agreed with the plan and demonstrated an understanding of the instructions.   The patient was advised to call back or seek an in-person evaluation if the symptoms worsen or if the condition fails to improve as anticipated.  The above assessment and management plan was discussed with the patient. The patient verbalized understanding of and has agreed to the management plan. Patient is aware to call the clinic if symptoms persist or worsen. Patient is aware when to return to the clinic for a follow-up visit. Patient educated on when it is appropriate to go to the emergency department.   Time call ended:  1:57 pm   I provided 17 minutes of non-face-to-face time during this encounter.    Evelina Dun, FNP

## 2019-06-26 NOTE — Patient Instructions (Addendum)
Generalized Anxiety Disorder, Adult Generalized anxiety disorder (GAD) is a mental health disorder. People with this condition constantly worry about everyday events. Unlike normal anxiety, worry related to GAD is not triggered by a specific event. These worries also do not fade or get better with time. GAD interferes with life functions, including relationships, work, and school. GAD can vary from mild to severe. People with severe GAD can have intense waves of anxiety with physical symptoms (panic attacks). What are the causes? The exact cause of GAD is not known. What increases the risk? This condition is more likely to develop in:  Women.  People who have a family history of anxiety disorders.  People who are very shy.  People who experience very stressful life events, such as the death of a loved one.  People who have a very stressful family environment. What are the signs or symptoms? People with GAD often worry excessively about many things in their lives, such as their health and family. They may also be overly concerned about:  Doing well at work.  Being on time.  Natural disasters.  Friendships. Physical symptoms of GAD include:  Fatigue.  Muscle tension or having muscle twitches.  Trembling or feeling shaky.  Being easily startled.  Feeling like your heart is pounding or racing.  Feeling out of breath or like you cannot take a deep breath.  Having trouble falling asleep or staying asleep.  Sweating.  Nausea, diarrhea, or irritable bowel syndrome (IBS).  Headaches.  Trouble concentrating or remembering facts.  Restlessness.  Irritability. How is this diagnosed? Your health care provider can diagnose GAD based on your symptoms and medical history. You will also have a physical exam. The health care provider will ask specific questions about your symptoms, including how severe they are, when they started, and if they come and go. Your health care  provider may ask you about your use of alcohol or drugs, including prescription medicines. Your health care provider may refer you to a mental health specialist for further evaluation. Your health care provider will do a thorough examination and may perform additional tests to rule out other possible causes of your symptoms. To be diagnosed with GAD, a person must have anxiety that:  Is out of his or her control.  Affects several different aspects of his or her life, such as work and relationships.  Causes distress that makes him or her unable to take part in normal activities.  Includes at least three physical symptoms of GAD, such as restlessness, fatigue, trouble concentrating, irritability, muscle tension, or sleep problems. Before your health care provider can confirm a diagnosis of GAD, these symptoms must be present more days than they are not, and they must last for six months or longer. How is this treated? The following therapies are usually used to treat GAD:  Medicine. Antidepressant medicine is usually prescribed for long-term daily control. Antianxiety medicines may be added in severe cases, especially when panic attacks occur.  Talk therapy (psychotherapy). Certain types of talk therapy can be helpful in treating GAD by providing support, education, and guidance. Options include: ? Cognitive behavioral therapy (CBT). People learn coping skills and techniques to ease their anxiety. They learn to identify unrealistic or negative thoughts and behaviors and to replace them with positive ones. ? Acceptance and commitment therapy (ACT). This treatment teaches people how to be mindful as a way to cope with unwanted thoughts and feelings. ? Biofeedback. This process trains you to manage your body's response (  physiological response) through breathing techniques and relaxation methods. You will work with a therapist while machines are used to monitor your physical symptoms.  Stress  management techniques. These include yoga, meditation, and exercise. A mental health specialist can help determine which treatment is best for you. Some people see improvement with one type of therapy. However, other people require a combination of therapies. Follow these instructions at home:  Take over-the-counter and prescription medicines only as told by your health care provider.  Try to maintain a normal routine.  Try to anticipate stressful situations and allow extra time to manage them.  Practice any stress management or self-calming techniques as taught by your health care provider.  Do not punish yourself for setbacks or for not making progress.  Try to recognize your accomplishments, even if they are small.  Keep all follow-up visits as told by your health care provider. This is important. Contact a health care provider if:  Your symptoms do not get better.  Your symptoms get worse.  You have signs of depression, such as: ? A persistently sad, cranky, or irritable mood. ? Loss of enjoyment in activities that used to bring you joy. ? Change in weight or eating. ? Changes in sleeping habits. ? Avoiding friends or family members. ? Loss of energy for normal tasks. ? Feelings of guilt or worthlessness. Get help right away if:  You have serious thoughts about hurting yourself or others. If you ever feel like you may hurt yourself or others, or have thoughts about taking your own life, get help right away. You can go to your nearest emergency department or call:  Your local emergency services (911 in the [place]  A suicide crisis helpline, such as the [place] at 412-708-7955. This is open 24 hours a day. Summary  Generalized anxiety disorder (GAD) is a mental health disorder that involves worry that is not triggered by a specific event.  People with GAD often worry excessively about many things in their lives, such as their health and  family.  GAD may cause physical symptoms such as restlessness, trouble concentrating, sleep problems, frequent sweating, nausea, diarrhea, headaches, and trembling or muscle twitching.  A mental health specialist can help determine which treatment is best for you. Some people see improvement with one type of therapy. However, other people require a combination of therapies. This information is not intended to replace advice given to you by your health care provider. Make sure you discuss any questions you have with your health care provider. Document Revised: 12/15/2016 Document Reviewed: 11/23/2015 Elsevier Patient Education  [employer].  Your provider wants you to schedule an appointment with a Psychologist/Psychiatrist. The following list of offices requires the patient to call and make their own appointment, as there is information they need that only you can provide. Please feel free to choose form the following providers:  [facility] in [place]  [place]  [phone] [place], [place]  (Scheduled through [facility]) Must call and do an interview for appointment. Sees Children / Accepts Medicaid  [facility] [place]  [address], [place], [place]  [phone] [place], [place] for Autism but does not treat it Sees Children / Accepts Medicaid  [facility]    [phone] [address]   [place], [place] Medication management, substance abuse, bipolar, grief, family, marriage, OCD, anxiety, PTSD Sees children / Accepts Medicaid  [place]    [place], [place], [place] children / Accepts [facility]  [facility]  [phone] [address] [place], [place]   Dr Lorenza Evangelist     [phone] [address] [place], [place]  Sees ADD & ADHD for treatment Accepts Medicaid  [facility]  [phone] [address] [place], [place] Evaluates for Autism Accepts [facility]  [facility]  [phone] [place], [place]  Does Adult ADD evaluations Does not accept Medicaid  [facility]   [phone] [place], [place] therapy  Sees children as young as 67 years old Accepts [facility]     [phone]    [place], [place] [address] Sees children Accepts Medicaid

## 2019-08-21 ENCOUNTER — Ambulatory Visit: Payer: No Typology Code available for payment source | Admitting: Family

## 2019-09-09 ENCOUNTER — Telehealth: Payer: Self-pay | Admitting: Family Medicine

## 2019-09-09 DIAGNOSIS — Z1152 Encounter for screening for COVID-19: Secondary | ICD-10-CM

## 2019-09-09 NOTE — Telephone Encounter (Signed)
Will order COVID test.

## 2019-09-10 ENCOUNTER — Other Ambulatory Visit: Payer: Self-pay | Admitting: Family Medicine

## 2019-09-10 ENCOUNTER — Telehealth: Payer: Self-pay | Admitting: Family

## 2019-09-10 ENCOUNTER — Encounter: Payer: Self-pay | Admitting: Family Medicine

## 2019-09-10 ENCOUNTER — Ambulatory Visit (INDEPENDENT_AMBULATORY_CARE_PROVIDER_SITE_OTHER): Payer: No Typology Code available for payment source | Admitting: Family Medicine

## 2019-09-10 DIAGNOSIS — N10 Acute tubulo-interstitial nephritis: Secondary | ICD-10-CM

## 2019-09-10 LAB — NOVEL CORONAVIRUS, NAA: SARS-CoV-2, NAA: NOT DETECTED

## 2019-09-10 LAB — SARS-COV-2, NAA 2 DAY TAT

## 2019-09-10 MED ORDER — PHENAZOPYRIDINE HCL 200 MG PO TABS: 200.0000 mg | ORAL_TABLET | Freq: Four times a day (QID) | ORAL | 0 refills | Status: AC | PRN

## 2019-09-10 MED ORDER — SULFAMETHOXAZOLE-TRIMETHOPRIM 800-160 MG PO TABS: 1.0000 | ORAL_TABLET | Freq: Two times a day (BID) | ORAL | 0 refills | Status: DC

## 2019-09-10 MED ORDER — CIPROFLOXACIN HCL 500 MG PO TABS: 500.0000 mg | ORAL_TABLET | Freq: Two times a day (BID) | ORAL | 0 refills | Status: DC

## 2019-09-10 NOTE — Telephone Encounter (Signed)
Patient aware.

## 2019-09-10 NOTE — Telephone Encounter (Signed)
I sent in bactrim to replace cipro

## 2019-09-10 NOTE — Progress Notes (Signed)
Subjective:    Patient ID: Meredith Hall, female    DOB: [birth year], 29 y.o.   MRN: [id number]   HPI: Meredith Hall is a 29 y.o. female presenting for back is killing me. Sweating, fever. A little burning with urination. No appetite. No nausea vomiting or diarrhea. Pain is lumbar and midline. Was in [place] until 6 days ago. Didn't drink the local water. Drank bottled sodas. Had a neg CoVID test last week and again 2 days ago. Had a test yesterday that is pending   Depression screen [facility] 2/9 07/17/2018 01/31/2018 10/17/2017 06/14/2017 05/02/2017  Decreased Interest 0 0 0 0 0  Down, Depressed, Hopeless 0 0 0 0 0  PHQ - 2 Score 0 0 0 0 0     Relevant past medical, surgical, family and social history reviewed and updated as indicated.  Interim medical history since our last visit reviewed. Allergies and medications reviewed and updated.  ROS:  Review of Systems  Constitutional: Positive for diaphoresis and fever.  HENT: Negative.   Respiratory: Negative for shortness of breath.   Cardiovascular: Negative for chest pain.  Gastrointestinal: Negative for abdominal pain.  Genitourinary: Positive for dysuria.  Musculoskeletal: Positive for back pain. Negative for arthralgias.     Social History   Tobacco Use  Smoking Status Never Smoker  Smokeless Tobacco Never Used       Objective:     Wt Readings from Last 3 Encounters:  04/29/19 150 lb (68 kg)  07/17/18 140 lb (63.5 kg)  03/28/18 143 lb (64.9 kg)     Exam deferred. Pt. [place] due to COVID 19. Phone visit performed.   Assessment & Plan:   1. Acute pyelonephritis     Meds ordered this encounter  Medications  . ciprofloxacin (CIPRO) 500 MG tablet    Sig: Take 1 tablet (500 mg total) by mouth 2 (two) times daily.    Dispense:  14 tablet    Refill:  0  . phenazopyridine (PYRIDIUM) 200 MG tablet    Sig: Take 1 tablet (200 mg total) by mouth 4 (four) times daily as needed for up to 2 days for pain (in  back or bladder).    Dispense:  8 tablet    Refill:  0    No orders of the defined types were placed in this encounter.     Diagnoses and all orders for this visit:  Acute pyelonephritis  Other orders -     ciprofloxacin (CIPRO) 500 MG tablet; Take 1 tablet (500 mg total) by mouth 2 (two) times daily. -     phenazopyridine (PYRIDIUM) 200 MG tablet; Take 1 tablet (200 mg total) by mouth 4 (four) times daily as needed for up to 2 days for pain (in back or bladder).    Virtual Visit via telephone Note  I discussed the limitations, risks, security and privacy concerns of performing an evaluation and management service by telephone and the availability of in person appointments. The patient was identified with two identifiers. Pt.expressed understanding and agreed to proceed. Pt. Is at home. Dr. Livia Snellen is in his office.  Follow Up Instructions:   I discussed the assessment and treatment plan with the patient. The patient was provided an opportunity to ask questions and all were answered. The patient agreed with the plan and demonstrated an understanding of the instructions.   The patient was advised to call back or seek an in-person evaluation if the symptoms worsen or if the condition fails  to improve as anticipated.   Total minutes including chart review and phone contact time: 12   Follow up plan: Return if symptoms worsen or fail to improve.  Claretta Fraise, MD [place]

## 2019-09-10 NOTE — Telephone Encounter (Signed)
[facility] in [place] states the cipro is on back order and wanting to see if the dr wants to transfer the rx to another pharmacy or call in a new rx. Please advise.

## 2019-09-12 ENCOUNTER — Telehealth: Payer: Self-pay

## 2019-09-12 MED ORDER — ONDANSETRON 4 MG PO TBDP: 4.0000 mg | ORAL_TABLET | Freq: Three times a day (TID) | ORAL | 0 refills | Status: DC | PRN

## 2019-09-12 NOTE — Addendum Note (Signed)
Addended by: Hendricks Limes F on: 09/12/2019 09:00 AM   Modules accepted: Orders

## 2019-09-12 NOTE — Telephone Encounter (Signed)
Patient states she has covid and is nauseous. Please send in Zofran to [facility] in [place].

## 2019-09-26 ENCOUNTER — Other Ambulatory Visit (INDEPENDENT_AMBULATORY_CARE_PROVIDER_SITE_OTHER): Payer: No Typology Code available for payment source

## 2019-09-26 ENCOUNTER — Other Ambulatory Visit: Payer: Self-pay

## 2019-09-26 ENCOUNTER — Other Ambulatory Visit: Payer: Self-pay | Admitting: Family Medicine

## 2019-09-26 DIAGNOSIS — R0602 Shortness of breath: Secondary | ICD-10-CM

## 2019-10-03 ENCOUNTER — Other Ambulatory Visit: Payer: No Typology Code available for payment source

## 2019-10-03 ENCOUNTER — Telehealth: Payer: Self-pay | Admitting: Family

## 2019-10-03 DIAGNOSIS — B379 Candidiasis, unspecified: Secondary | ICD-10-CM

## 2019-10-03 DIAGNOSIS — R3989 Other symptoms and signs involving the genitourinary system: Secondary | ICD-10-CM

## 2019-10-03 DIAGNOSIS — T3695XA Adverse effect of unspecified systemic antibiotic, initial encounter: Secondary | ICD-10-CM

## 2019-10-03 LAB — URINALYSIS, COMPLETE
Bilirubin, UA: NEGATIVE
Glucose, UA: NEGATIVE
Ketones, UA: NEGATIVE
Leukocytes,UA: NEGATIVE
Nitrite, UA: NEGATIVE
Protein,UA: NEGATIVE
RBC, UA: NEGATIVE
Specific Gravity, UA: 1.02 (ref 1.005–1.030)
Urobilinogen, Ur: 1 mg/dL (ref 0.2–1.0)
pH, UA: 7 (ref 5.0–7.5)

## 2019-10-03 LAB — MICROSCOPIC EXAMINATION
Bacteria, UA: NONE SEEN
RBC, Urine: NONE SEEN /hpf (ref 0–2)
WBC, UA: NONE SEEN /hpf (ref 0–5)

## 2019-10-03 LAB — WET PREP FOR TRICH, YEAST, CLUE
Clue Cell Exam: NEGATIVE
Trichomonas Exam: NEGATIVE
Yeast Exam: NEGATIVE

## 2019-10-03 NOTE — Telephone Encounter (Signed)
Urine and wet prep ordered.

## 2019-10-03 NOTE — Telephone Encounter (Signed)
Pt had televisit 09/10/2019 [place]. She says that uti has not cleared. Still has burning u urination and back pain. She thinks she yeast too.

## 2019-10-03 NOTE — Telephone Encounter (Signed)
Patient aware.

## 2019-10-05 LAB — URINE CULTURE

## 2019-10-17 ENCOUNTER — Ambulatory Visit (INDEPENDENT_AMBULATORY_CARE_PROVIDER_SITE_OTHER): Payer: No Typology Code available for payment source

## 2019-10-17 ENCOUNTER — Other Ambulatory Visit: Payer: Self-pay

## 2019-10-17 DIAGNOSIS — Z23 Encounter for immunization: Secondary | ICD-10-CM | POA: Diagnosis not present

## 2019-10-17 NOTE — Progress Notes (Signed)
   Covid-19 Vaccination Clinic  Name:  Meredith Hall    MRN: [id number] DOB: 06-02-1990  10/17/2019  [name] was observed post Covid-19 immunization for 15 minutes without incident. She was provided with Vaccine Information Sheet and instruction to access the V-Safe system.   [name] was instructed to call 911 with any severe reactions post vaccine: Marland Kitchen Difficulty breathing  . Swelling of face and throat  . A fast heartbeat  . A bad rash all over body  . Dizziness and weakness   Immunizations Administered    Name Date Dose VIS Date Route   Pfizer COVID-19 Vaccine 10/17/2019 11:30 AM 0.3 mL 03/12/2018 Intramuscular   Manufacturer: [place]   Lot: [id number]   [place]: [id number]

## 2019-11-10 ENCOUNTER — Ambulatory Visit (INDEPENDENT_AMBULATORY_CARE_PROVIDER_SITE_OTHER): Payer: No Typology Code available for payment source | Admitting: *Deleted

## 2019-11-10 ENCOUNTER — Other Ambulatory Visit: Payer: Self-pay

## 2019-11-10 DIAGNOSIS — Z23 Encounter for immunization: Secondary | ICD-10-CM

## 2019-11-10 NOTE — Progress Notes (Signed)
   Covid-19 Vaccination Clinic  Name:  Meredith Hall    MRN: [id number] DOB: 30-Dec-1990  11/10/2019  Meredith Hall was observed post Covid-19 immunization for 15 minutes without incident. She was provided with Vaccine Information Sheet and instruction to access the V-Safe system.   Meredith Hall was instructed to call 911 with any severe reactions post vaccine: Marland Kitchen Difficulty breathing  . Swelling of face and throat  . A fast heartbeat  . A bad rash all over body  . Dizziness and weakness

## 2019-12-09 ENCOUNTER — Ambulatory Visit: Payer: No Typology Code available for payment source | Admitting: Family

## 2019-12-15 ENCOUNTER — Ambulatory Visit (INDEPENDENT_AMBULATORY_CARE_PROVIDER_SITE_OTHER): Payer: No Typology Code available for payment source | Admitting: Family

## 2019-12-15 ENCOUNTER — Other Ambulatory Visit: Payer: Self-pay

## 2019-12-15 ENCOUNTER — Encounter: Payer: Self-pay | Admitting: Family

## 2019-12-15 VITALS — BP 128/80 | HR 76 | Temp 97.9°F | Ht 64.0 in | Wt 157.6 lb

## 2019-12-15 DIAGNOSIS — L659 Nonscarring hair loss, unspecified: Secondary | ICD-10-CM | POA: Diagnosis not present

## 2019-12-15 DIAGNOSIS — Z6825 Body mass index (BMI) 25.0-25.9, adult: Secondary | ICD-10-CM | POA: Diagnosis not present

## 2019-12-15 DIAGNOSIS — Z713 Dietary counseling and surveillance: Secondary | ICD-10-CM | POA: Diagnosis not present

## 2019-12-15 MED ORDER — PHENTERMINE HCL 37.5 MG PO TABS: 37.5000 mg | ORAL_TABLET | Freq: Every day | ORAL | 2 refills | Status: DC

## 2019-12-15 NOTE — Progress Notes (Signed)
   Subjective:    Patient ID: Meredith Hall, female    DOB: 1990-09-02, 29 y.o.   MRN: [id number]  No chief complaint on file.   HPI PT presents to the office today to discuss weight loss. She reports she is the most heavy she has been in her life. She reports she tries to walk for 30 mins 2-3 times a week. She admits her diet is not the best, but can not lose any weight.   She reports since having COVID in 08/2019 she has been losing her hair.    Review of Systems  All other systems reviewed and are negative.      Objective:   Physical Exam Vitals reviewed.  Constitutional:      General: She is not in acute distress.    Appearance: She is well-developed.  HENT:     Head: Normocephalic and atraumatic.     Right Ear: Tympanic membrane normal.     Left Ear: Tympanic membrane normal.  Eyes:     Pupils: Pupils are equal, round, and reactive to light.  Neck:     Thyroid: No thyromegaly.  Cardiovascular:     Rate and Rhythm: Normal rate and regular rhythm.     Heart sounds: Normal heart sounds. No murmur heard.   Pulmonary:     Effort: Pulmonary effort is normal. No respiratory distress.     Breath sounds: Normal breath sounds. No wheezing.  Abdominal:     General: Bowel sounds are normal. There is no distension.     Palpations: Abdomen is soft.     Tenderness: There is no abdominal tenderness.  Musculoskeletal:        General: No tenderness. Normal range of motion.     Cervical back: Normal range of motion and neck supple.  Skin:    General: Skin is warm and dry.  Neurological:     Mental Status: She is alert and oriented to person, place, and time.     Cranial Nerves: No cranial nerve deficit.     Deep Tendon Reflexes: Reflexes are normal and symmetric.  Psychiatric:        Behavior: Behavior normal.        Thought Content: Thought content normal.        Judgment: Judgment normal.       BP 128/80   Pulse 76   Temp 97.9 F (36.6 C) (Temporal)   Ht 5'  4" (1.626 m)   Wt 157 lb 9.6 oz (71.5 kg)   BMI 27.05 kg/m      Assessment & Plan:  [name] comes in today with chief complaint of Weight Loss   Diagnosis and orders addressed:  1. BMI 25.0-25.9,adult - phentermine (ADIPEX-P) 37.5 MG tablet; Take 1 tablet (37.5 mg total) by mouth daily before breakfast.  Dispense: 30 tablet; Refill: 2  2. Weight loss counseling, encounter for - phentermine (ADIPEX-P) 37.5 MG tablet; Take 1 tablet (37.5 mg total) by mouth daily before breakfast.  Dispense: 30 tablet; Refill: 2  3. Hair loss  Will start phentermine today Avoid caffeine  Encouraged healthy diet and exercise Will hold off on TSH at this time as she had normal on 04/21    Evelina Dun, FNP

## 2019-12-15 NOTE — Patient Instructions (Signed)

## 2019-12-17 ENCOUNTER — Other Ambulatory Visit: Payer: Self-pay

## 2019-12-17 ENCOUNTER — Other Ambulatory Visit: Payer: Self-pay | Admitting: Family

## 2019-12-17 DIAGNOSIS — Z713 Dietary counseling and surveillance: Secondary | ICD-10-CM

## 2019-12-17 DIAGNOSIS — F411 Generalized anxiety disorder: Secondary | ICD-10-CM

## 2019-12-17 DIAGNOSIS — Z6825 Body mass index (BMI) 25.0-25.9, adult: Secondary | ICD-10-CM

## 2019-12-17 MED ORDER — PHENTERMINE HCL 37.5 MG PO TABS: 37.5000 mg | ORAL_TABLET | Freq: Every day | ORAL | 2 refills | Status: DC

## 2019-12-17 MED ORDER — ESCITALOPRAM OXALATE 10 MG PO TABS: 10.0000 mg | ORAL_TABLET | Freq: Every day | ORAL | 3 refills | Status: DC

## 2019-12-17 MED FILL — ESCITALOPRAM 10 MG TABLET: 10 | 90 days supply | Qty: 90 | Fill #0

## 2019-12-18 ENCOUNTER — Other Ambulatory Visit: Payer: Self-pay | Admitting: Family Medicine

## 2019-12-18 ENCOUNTER — Telehealth: Payer: Self-pay | Admitting: Family Medicine

## 2019-12-18 DIAGNOSIS — Z713 Dietary counseling and surveillance: Secondary | ICD-10-CM

## 2019-12-18 DIAGNOSIS — Z6825 Body mass index (BMI) 25.0-25.9, adult: Secondary | ICD-10-CM

## 2019-12-18 MED ORDER — PHENTERMINE HCL 37.5 MG PO TABS: 37.5000 mg | ORAL_TABLET | Freq: Every day | ORAL | 2 refills | Status: DC

## 2019-12-18 MED FILL — PHENTERMINE 37.5 MG TABLET: 37.5 | 30 days supply | Qty: 30 | Fill #0

## 2019-12-18 NOTE — Telephone Encounter (Signed)
Prescription sent to pharmacy.

## 2020-02-25 ENCOUNTER — Other Ambulatory Visit: Payer: Self-pay | Admitting: Family Medicine

## 2020-02-25 MED ORDER — DESONIDE 0.05 % EX CREA: TOPICAL_CREAM | Freq: Two times a day (BID) | CUTANEOUS | 1 refills | Status: DC

## 2020-04-01 ENCOUNTER — Other Ambulatory Visit: Payer: Self-pay | Admitting: Family

## 2020-04-01 MED ORDER — NORGESTIMATE-ETH ESTRADIOL 0.25-35 MG-MCG PO TABS: 1.0000 | ORAL_TABLET | Freq: Every day | ORAL | 3 refills | Status: DC

## 2020-04-01 NOTE — Progress Notes (Signed)
Request to send to [facility]

## 2020-04-01 NOTE — Addendum Note (Signed)
Addended by: Antonietta Barcelona D on: 04/01/2020 04:13 PM   Modules accepted: Orders

## 2020-04-01 NOTE — Progress Notes (Signed)
Pt complaining of heavy bleeding and cramping. Sprintec Prescription sent to pharmacy.

## 2020-04-05 ENCOUNTER — Other Ambulatory Visit (HOSPITAL_BASED_OUTPATIENT_CLINIC_OR_DEPARTMENT_OTHER): Payer: Self-pay

## 2020-05-07 ENCOUNTER — Encounter: Payer: Self-pay | Admitting: Family

## 2020-05-07 ENCOUNTER — Other Ambulatory Visit (HOSPITAL_COMMUNITY): Payer: Self-pay

## 2020-05-07 ENCOUNTER — Ambulatory Visit (INDEPENDENT_AMBULATORY_CARE_PROVIDER_SITE_OTHER): Payer: No Typology Code available for payment source | Admitting: Family

## 2020-05-07 ENCOUNTER — Other Ambulatory Visit: Payer: Self-pay

## 2020-05-07 VITALS — BP 137/85 | HR 69 | Temp 98.2°F | Ht 64.0 in | Wt 150.0 lb

## 2020-05-07 DIAGNOSIS — J309 Allergic rhinitis, unspecified: Secondary | ICD-10-CM

## 2020-05-07 DIAGNOSIS — J301 Allergic rhinitis due to pollen: Secondary | ICD-10-CM | POA: Diagnosis not present

## 2020-05-07 DIAGNOSIS — H9201 Otalgia, right ear: Secondary | ICD-10-CM | POA: Diagnosis not present

## 2020-05-07 MED ORDER — CETIRIZINE HCL 10 MG PO TABS
10.0000 mg | ORAL_TABLET | Freq: Every day | ORAL | 3 refills | Status: DC
  Filled 2020-05-07: qty 90, 90d supply, fill #0

## 2020-05-07 MED ORDER — CETIRIZINE HCL 10 MG PO TABS: 10.0000 mg | ORAL_TABLET | Freq: Every day | ORAL | 3 refills | Status: DC

## 2020-05-07 MED ORDER — MOMETASONE FUROATE 50 MCG/ACT NA SUSP
2.0000 | Freq: Every day | NASAL | 4 refills | Status: DC
  Filled 2020-05-07: qty 51, 90d supply, fill #0

## 2020-05-07 NOTE — Progress Notes (Signed)
Subjective:    Patient ID: Meredith Hall, female    DOB: 07-Oct-1990, 30 y.o.   MRN: [id number]  Chief Complaint  Patient presents with  . Ear Pain    RIGHT EAR PAIN ON GOING. NO DRAINAGE. DID NOT GO TO ENT REFERRAL. DID NOT GO TO ENT REFERRAL     Otalgia  There is pain in the right ear. This is a new problem. The current episode started in the past 7 days. The problem occurs every few minutes. The problem has been waxing and waning. There has been no fever. The pain is at a severity of 4/10. The pain is mild. Associated symptoms include rhinorrhea. Pertinent negatives include no coughing, ear discharge, headaches, hearing loss, sore throat or vomiting. She has tried acetaminophen for the symptoms. The treatment provided mild relief.      Review of Systems  HENT: Positive for ear pain and rhinorrhea. Negative for ear discharge, hearing loss and sore throat.   Respiratory: Negative for cough.   Gastrointestinal: Negative for vomiting.  Neurological: Negative for headaches.  All other systems reviewed and are negative.      Objective:   Physical Exam Vitals reviewed.  Constitutional:      General: She is not in acute distress.    Appearance: She is well-developed.  HENT:     Head: Normocephalic and atraumatic.     Right Ear: Tympanic membrane is bulging.     Left Ear: Tympanic membrane normal.     Nose:     Right Turbinates: Enlarged.     Left Turbinates: Enlarged.     Mouth/Throat:     Pharynx: Posterior oropharyngeal erythema present.  Eyes:     Pupils: Pupils are equal, round, and reactive to light.  Neck:     Thyroid: No thyromegaly.  Cardiovascular:     Rate and Rhythm: Normal rate and regular rhythm.     Heart sounds: Normal heart sounds. No murmur heard.   Pulmonary:     Effort: Pulmonary effort is normal. No respiratory distress.     Breath sounds: Normal breath sounds. No wheezing.  Abdominal:     General: Bowel sounds are normal. There is no  distension.     Palpations: Abdomen is soft.     Tenderness: There is no abdominal tenderness.  Musculoskeletal:        General: No tenderness. Normal range of motion.     Cervical back: Normal range of motion and neck supple.  Skin:    General: Skin is warm and dry.  Neurological:     Mental Status: She is alert and oriented to person, place, and time.     Cranial Nerves: No cranial nerve deficit.     Deep Tendon Reflexes: Reflexes are normal and symmetric.  Psychiatric:        Behavior: Behavior normal.        Thought Content: Thought content normal.        Judgment: Judgment normal.     BP 137/85   Pulse 69   Temp 98.2 F (36.8 C) (Temporal)   Ht 5\' 4"  (1.626 m)   Wt 150 lb (68 kg)   BMI 25.75 kg/m        Assessment & Plan:  Meredith Hall comes in today with chief complaint of Ear Pain (RIGHT EAR PAIN ON GOING. NO DRAINAGE. DID NOT GO TO ENT REFERRAL. DID NOT GO TO ENT REFERRAL )   Diagnosis and orders addressed:  1. Allergic  rhinitis due to pollen, unspecified seasonality - mometasone (NASONEX) 50 MCG/ACT nasal spray; Place 2 sprays into the nose daily.  Dispense: 51 g; Refill: 4  2. Right ear pain - cetirizine (ZYRTEC) 10 MG tablet; Take 1 tablet (10 mg total) by mouth daily.  Dispense: 90 tablet; Refill: 3  3. Allergic rhinitis - cetirizine (ZYRTEC) 10 MG tablet; Take 1 tablet (10 mg total) by mouth daily.  Dispense: 90 tablet; Refill: 3   Pt will restart zyrtec. Continue Singulair and Nasonex Avoid allergens Tylenol as needed RTO if symptoms worsen or do not improve   Meredith Dun, FNP

## 2020-05-07 NOTE — Patient Instructions (Signed)

## 2020-05-12 ENCOUNTER — Telehealth: Payer: Self-pay | Admitting: *Deleted

## 2020-05-12 DIAGNOSIS — J301 Allergic rhinitis due to pollen: Secondary | ICD-10-CM

## 2020-05-12 MED ORDER — MOMETASONE FUROATE 50 MCG/ACT NA SUSP: 2.0000 | Freq: Every day | NASAL | 0 refills | Status: DC

## 2020-05-12 NOTE — Telephone Encounter (Signed)
Needing one month supply of Mometasone to local pharmacy

## 2020-05-13 ENCOUNTER — Other Ambulatory Visit: Payer: Self-pay | Admitting: Family Medicine

## 2020-05-13 ENCOUNTER — Other Ambulatory Visit (HOSPITAL_COMMUNITY): Payer: Self-pay

## 2020-05-13 ENCOUNTER — Other Ambulatory Visit: Payer: Self-pay | Admitting: Family

## 2020-05-13 DIAGNOSIS — J301 Allergic rhinitis due to pollen: Secondary | ICD-10-CM

## 2020-05-13 MED ORDER — AZELASTINE-FLUTICASONE 137-50 MCG/ACT NA SUSP
1.0000 | Freq: Two times a day (BID) | NASAL | 0 refills | Status: DC
  Filled 2020-05-13: qty 23, 30d supply, fill #0
  Filled 2020-05-20: qty 23, 60d supply, fill #0

## 2020-05-13 MED ORDER — MOMETASONE FUROATE 50 MCG/ACT NA SUSP
2.0000 | Freq: Every day | NASAL | 0 refills | Status: DC
  Filled 2020-05-13: qty 51, 90d supply, fill #0

## 2020-05-13 MED ORDER — AZELASTINE-FLUTICASONE 137-50 MCG/ACT NA SUSP: 1.0000 | Freq: Two times a day (BID) | NASAL | 0 refills | Status: DC

## 2020-05-13 NOTE — Addendum Note (Signed)
Addended by: Antonietta Barcelona D on: 05/13/2020 08:15 AM   Modules accepted: Orders

## 2020-05-13 NOTE — Telephone Encounter (Signed)
Change of Mometasone to Azelastine-Fluticasone d/t insurance, resent to [employer]

## 2020-05-13 NOTE — Telephone Encounter (Signed)
Rx was too expensive at local pharmacy, needed to be sent back to [facility]

## 2020-05-14 ENCOUNTER — Telehealth: Payer: Self-pay | Admitting: Family Medicine

## 2020-05-14 NOTE — Telephone Encounter (Signed)
Your information has been sent to [employer]. 

## 2020-05-17 ENCOUNTER — Other Ambulatory Visit: Payer: Self-pay | Admitting: *Deleted

## 2020-05-18 ENCOUNTER — Telehealth: Payer: Self-pay | Admitting: Family Medicine

## 2020-05-18 ENCOUNTER — Telehealth: Payer: Self-pay

## 2020-05-18 ENCOUNTER — Ambulatory Visit: Payer: No Typology Code available for payment source | Admitting: Family

## 2020-05-18 MED ORDER — DESONIDE 0.05 % EX CREA: TOPICAL_CREAM | Freq: Two times a day (BID) | CUTANEOUS | 0 refills | Status: DC

## 2020-05-18 NOTE — Telephone Encounter (Signed)
Lmtcb ac 05/03

## 2020-05-18 NOTE — Telephone Encounter (Signed)
Sent to plan today  

## 2020-05-18 NOTE — Telephone Encounter (Signed)
Deniedon May 2 This request has not been approved

## 2020-05-20 ENCOUNTER — Other Ambulatory Visit (HOSPITAL_COMMUNITY): Payer: Self-pay

## 2020-05-20 NOTE — Telephone Encounter (Signed)
PA approved from 05/19/20-05/18/2021.  Pharmacy and patient aware.

## 2020-05-24 ENCOUNTER — Other Ambulatory Visit (HOSPITAL_COMMUNITY): Payer: Self-pay

## 2020-06-04 ENCOUNTER — Other Ambulatory Visit: Payer: Self-pay | Admitting: Family Medicine

## 2020-06-04 ENCOUNTER — Ambulatory Visit (INDEPENDENT_AMBULATORY_CARE_PROVIDER_SITE_OTHER): Payer: No Typology Code available for payment source | Admitting: Family

## 2020-06-04 ENCOUNTER — Encounter: Payer: Self-pay | Admitting: Family

## 2020-06-04 ENCOUNTER — Other Ambulatory Visit (HOSPITAL_COMMUNITY): Payer: Self-pay

## 2020-06-04 ENCOUNTER — Other Ambulatory Visit: Payer: Self-pay

## 2020-06-04 VITALS — BP 134/87 | HR 73 | Temp 98.6°F | Ht 64.0 in | Wt 156.0 lb

## 2020-06-04 DIAGNOSIS — F320 Major depressive disorder, single episode, mild: Secondary | ICD-10-CM | POA: Diagnosis not present

## 2020-06-04 DIAGNOSIS — E663 Overweight: Secondary | ICD-10-CM | POA: Diagnosis not present

## 2020-06-04 DIAGNOSIS — Z0000 Encounter for general adult medical examination without abnormal findings: Secondary | ICD-10-CM

## 2020-06-04 DIAGNOSIS — Z713 Dietary counseling and surveillance: Secondary | ICD-10-CM | POA: Diagnosis not present

## 2020-06-04 DIAGNOSIS — F411 Generalized anxiety disorder: Secondary | ICD-10-CM | POA: Diagnosis not present

## 2020-06-04 DIAGNOSIS — Z0001 Encounter for general adult medical examination with abnormal findings: Secondary | ICD-10-CM

## 2020-06-04 MED ORDER — OZEMPIC (0.25 OR 0.5 MG/DOSE) 2 MG/1.5ML ~~LOC~~ SOPN
PEN_INJECTOR | SUBCUTANEOUS | 1 refills | Status: DC
  Filled 2020-06-04: qty 3, 70d supply, fill #0

## 2020-06-04 MED ORDER — NORGESTIMATE-ETH ESTRADIOL 0.25-35 MG-MCG PO TABS
1.0000 | ORAL_TABLET | Freq: Every day | ORAL | 3 refills | Status: DC
  Filled 2020-06-04: qty 90, 90d supply, fill #0
  Filled 2020-06-24: qty 84, 84d supply, fill #0

## 2020-06-04 NOTE — Progress Notes (Signed)
Subjective:    Patient ID: [name], female    DOB: [birth year], 30 y.o.   MRN: [id number]  Chief Complaint  Patient presents with  . Annual Exam    Discuss going on ozempic. Mole on arm and spot on back    Pt presents to the office today for CPE without pap. She reports she is going to schedule her pap with her GYN.    Pt requesting to [place] for weight loss. She states she walks 3 times a week for 1 hours and trying to cut back. She states she lost about 10 lbs, but has gained some of that back.  She states she is currently happy with her Lexapro 10mg , but has thought about increasing. She will call if she decides she wants to increase.   Anxiety Presents for follow-up visit. Symptoms include depressed mood, excessive worry, nervous/anxious behavior and restlessness. Symptoms occur most days. The severity of symptoms is moderate.    Depression        This is a chronic problem.  The current episode started more than 1 year ago.   The problem occurs intermittently.  The problem has been waxing and waning since onset.  Associated symptoms include irritable, restlessness and sad.  Associated symptoms include no helplessness and no hopelessness.  Past medical history includes anxiety.       Review of Systems  Psychiatric/Behavioral: Positive for depression. The patient is nervous/anxious.   All other systems reviewed and are negative.  Family History  Problem Relation Age of Onset  . Thyroid disease Mother    Social History   Socioeconomic History  . Marital status: Single    Spouse name: Not on file  . Number of children: Not on file  . Years of education: Not on file  . Highest education level: Not on file  Occupational History  . Not on file  Tobacco Use  . Smoking status: Never Smoker  . Smokeless tobacco: Never Used  Vaping Use  . Vaping Use: Never used  Substance and Sexual Activity  . Alcohol use: No  . Drug use: No  . Sexual activity: Not on file   Other Topics Concern  . Not on file  Social History Narrative  . Not on file   Social Determinants of Health   Financial Resource Strain: Not on file  Food Insecurity: Not on file  Transportation Needs: Not on file  Physical Activity: Not on file  Stress: Not on file  Social Connections: Not on file       Objective:   Physical Exam Vitals reviewed.  Constitutional:      General: She is irritable. She is not in acute distress.    Appearance: She is well-developed.  HENT:     Head: Normocephalic and atraumatic.     Right Ear: Tympanic membrane normal.     Left Ear: Tympanic membrane normal.  Eyes:     Pupils: Pupils are equal, round, and reactive to light.  Neck:     Thyroid: No thyromegaly.  Cardiovascular:     Rate and Rhythm: Normal rate and regular rhythm.     Heart sounds: Normal heart sounds. No murmur heard.   Pulmonary:     Effort: Pulmonary effort is normal. No respiratory distress.     Breath sounds: Normal breath sounds. No wheezing.  Abdominal:     General: Bowel sounds are normal. There is no distension.     Palpations: Abdomen is soft.  Tenderness: There is no abdominal tenderness.  Musculoskeletal:        General: No tenderness. Normal range of motion.     Cervical back: Normal range of motion and neck supple.  Skin:    General: Skin is warm and dry.  Neurological:     Mental Status: She is alert and oriented to person, place, and time.     Cranial Nerves: No cranial nerve deficit.     Deep Tendon Reflexes: Reflexes are normal and symmetric.  Psychiatric:        Behavior: Behavior normal.        Thought Content: Thought content normal.        Judgment: Judgment normal.       BP 134/87   Pulse 73   Temp 98.6 F (37 C) (Temporal)   Ht 5\' 4"  (1.626 m)   Wt 156 lb (70.8 kg)   BMI 26.78 kg/m      Assessment & Plan:  AVALEY COOP comes in today with chief complaint of Annual Exam (Discuss going on ozempic. Mole on arm and spot  on back )   Diagnosis and orders addressed:  1. Annual physical exam  2. GAD (generalized anxiety disorder)  3. Overweight (BMI 25.0-29.9)  4. Depression, major, single episode, mild ([place])  5. Weight loss counseling, encounter for   Labs pending Health Maintenance reviewed Diet and exercise encouraged  Follow up plan: 1 year    Evelina Dun, FNP

## 2020-06-04 NOTE — Patient Instructions (Signed)
Calorie Counting for Weight Loss Calories are units of energy. Your body needs a certain number of calories from food to keep going throughout the day. When you eat or drink more calories than your body needs, your body stores the extra calories mostly as fat. When you eat or drink fewer calories than your body needs, your body burns fat to get the energy it needs. Calorie counting means keeping track of how many calories you eat and drink each day. Calorie counting can be helpful if you need to lose weight. If you eat fewer calories than your body needs, you should lose weight. Ask your health care provider what a healthy weight is for you. For calorie counting to work, you will need to eat the right number of calories each day to lose a healthy amount of weight per week. A dietitian can help you figure out how many calories you need in a day and will suggest ways to reach your calorie goal.  A healthy amount of weight to lose each week is usually 1-2 lb (0.5-0.9 kg). This usually means that your daily calorie intake should be reduced by 500-750 calories.  Eating 1,200-1,500 calories a day can help most women lose weight.  Eating 1,500-1,800 calories a day can help most men lose weight. What do I need to know about calorie counting? Work with your health care provider or dietitian to determine how many calories you should get each day. To meet your daily calorie goal, you will need to:  Find out how many calories are in each food that you would like to eat. Try to do this before you eat.  Decide how much of the food you plan to eat.  Keep a food log. Do this by writing down what you ate and how many calories it had. To successfully lose weight, it is important to balance calorie counting with a healthy lifestyle that includes regular activity. Where do I find calorie information? The number of calories in a food can be found on a Nutrition Facts label. If a food does not have a Nutrition Facts  label, try to look up the calories online or ask your dietitian for help. Remember that calories are listed per serving. If you choose to have more than one serving of a food, you will have to multiply the calories per serving by the number of servings you plan to eat. For example, the label on a package of bread might say that a serving size is 1 slice and that there are 90 calories in a serving. If you eat 1 slice, you will have eaten 90 calories. If you eat 2 slices, you will have eaten 180 calories.   How do I keep a food log? After each time that you eat, record the following in your food log as soon as possible:  What you ate. Be sure to include toppings, sauces, and other extras on the food.  How much you ate. This can be measured in cups, ounces, or number of items.  How many calories were in each food and drink.  The total number of calories in the food you ate. Keep your food log near you, such as in a pocket-sized notebook or on an app or website on your mobile phone. Some programs will calculate calories for you and show you how many calories you have left to meet your daily goal. What are some portion-control tips?  Know how many calories are in a serving. This will   help you know how many servings you can have of a certain food.  Use a measuring cup to measure serving sizes. You could also try weighing out portions on a kitchen scale. With time, you will be able to estimate serving sizes for some foods.  Take time to put servings of different foods on your favorite plates or in your favorite bowls and cups so you know what a serving looks like.  Try not to eat straight from a food's packaging, such as from a bag or box. Eating straight from the package makes it hard to see how much you are eating and can lead to overeating. Put the amount you would like to eat in a cup or on a plate to make sure you are eating the right portion.  Use smaller plates, glasses, and bowls for smaller  portions and to prevent overeating.  Try not to multitask. For example, avoid watching TV or using your computer while eating. If it is time to eat, sit down at a table and enjoy your food. This will help you recognize when you are full. It will also help you be more mindful of what and how much you are eating. What are tips for following this plan? Reading food labels  Check the calorie count compared with the serving size. The serving size may be smaller than what you are used to eating.  Check the source of the calories. Try to choose foods that are high in protein, fiber, and vitamins, and low in saturated fat, trans fat, and sodium. Shopping  Read nutrition labels while you shop. This will help you make healthy decisions about which foods to buy.  Pay attention to nutrition labels for low-fat or fat-free foods. These foods sometimes have the same number of calories or more calories than the full-fat versions. They also often have added sugar, starch, or salt to make up for flavor that was removed with the fat.  Make a grocery list of lower-calorie foods and stick to it. Cooking  Try to cook your favorite foods in a healthier way. For example, try baking instead of frying.  Use low-fat dairy products. Meal planning  Use more fruits and vegetables. One-half of your plate should be fruits and vegetables.  Include lean proteins, such as chicken, turkey, and fish. Lifestyle Each week, aim to do one of the following:  150 minutes of moderate exercise, such as walking.  75 minutes of vigorous exercise, such as running. General information  Know how many calories are in the foods you eat most often. This will help you calculate calorie counts faster.  Find a way of tracking calories that works for you. Get creative. Try different apps or programs if writing down calories does not work for you. What foods should I eat?  Eat nutritious foods. It is better to have a nutritious,  high-calorie food, such as an avocado, than a food with few nutrients, such as a bag of potato chips.  Use your calories on foods and drinks that will fill you up and will not leave you hungry soon after eating. ? Examples of foods that fill you up are nuts and nut butters, vegetables, lean proteins, and high-fiber foods such as whole grains. High-fiber foods are foods with more than 5 g of fiber per serving.  Pay attention to calories in drinks. Low-calorie drinks include water and unsweetened drinks. The items listed above may not be a complete list of foods and beverages you can eat.   Contact a dietitian for more information.   What foods should I limit? Limit foods or drinks that are not good sources of vitamins, minerals, or protein or that are high in unhealthy fats. These include:  Candy.  Other sweets.  Sodas, specialty coffee drinks, alcohol, and juice. The items listed above may not be a complete list of foods and beverages you should avoid. Contact a dietitian for more information. How do I count calories when eating out?  Pay attention to portions. Often, portions are much larger when eating out. Try these tips to keep portions smaller: ? Consider sharing a meal instead of getting your own. ? If you get your own meal, eat only half of it. Before you start eating, ask for a container and put half of your meal into it. ? When available, consider ordering smaller portions from the menu instead of full portions.  Pay attention to your food and drink choices. Knowing the way food is cooked and what is included with the meal can help you eat fewer calories. ? If calories are listed on the menu, choose the lower-calorie options. ? Choose dishes that include vegetables, fruits, whole grains, low-fat dairy products, and lean proteins. ? Choose items that are boiled, broiled, grilled, or steamed. Avoid items that are buttered, battered, fried, or served with cream sauce. Items labeled as  crispy are usually fried, unless stated otherwise. ? Choose water, low-fat milk, unsweetened iced tea, or other drinks without added sugar. If you want an alcoholic beverage, choose a lower-calorie option, such as a glass of wine or light beer. ? Ask for dressings, sauces, and syrups on the side. These are usually high in calories, so you should limit the amount you eat. ? If you want a salad, choose a garden salad and ask for grilled meats. Avoid extra toppings such as bacon, cheese, or fried items. Ask for the dressing on the side, or ask for olive oil and vinegar or lemon to use as dressing.  Estimate how many servings of a food you are given. Knowing serving sizes will help you be aware of how much food you are eating at restaurants. Where to find more information  Centers for Disease Control and Prevention: www.cdc.gov  [place] Department of Agriculture: myplate.gov Summary  Calorie counting means keeping track of how many calories you eat and drink each day. If you eat fewer calories than your body needs, you should lose weight.  A healthy amount of weight to lose per week is usually 1-2 lb (0.5-0.9 kg). This usually means reducing your daily calorie intake by 500-750 calories.  The number of calories in a food can be found on a Nutrition Facts label. If a food does not have a Nutrition Facts label, try to look up the calories online or ask your dietitian for help.  Use smaller plates, glasses, and bowls for smaller portions and to prevent overeating.  Use your calories on foods and drinks that will fill you up and not leave you hungry shortly after a meal. This information is not intended to replace advice given to you by your health care provider. Make sure you discuss any questions you have with your health care provider. Document Revised: 02/13/2019 Document Reviewed: 02/13/2019 Elsevier Patient Education  2021 Else[employer] Inc.  

## 2020-06-05 ENCOUNTER — Other Ambulatory Visit (HOSPITAL_COMMUNITY): Payer: Self-pay

## 2020-06-05 LAB — THYROID PANEL WITH TSH
Free Thyroxine Index: 1.5 (ref 1.2–4.9)
T3 Uptake Ratio: 15 % — ABNORMAL LOW (ref 24–39)
T4, Total: 10.2 ug/dL (ref 4.5–12.0)
TSH: 1.52 u[IU]/mL (ref 0.450–4.500)

## 2020-06-05 LAB — CMP14+EGFR
ALT: 14 IU/L (ref 0–32)
AST: 18 IU/L (ref 0–40)
Albumin/Globulin Ratio: 1.5 (ref 1.2–2.2)
Albumin: 4.2 g/dL (ref 3.9–5.0)
Alkaline Phosphatase: 63 IU/L (ref 44–121)
BUN/Creatinine Ratio: 25 — ABNORMAL HIGH (ref 9–23)
BUN: 15 mg/dL (ref 6–20)
Bilirubin Total: 0.2 mg/dL (ref 0.0–1.2)
CO2: 21 mmol/L (ref 20–29)
Calcium: 9.1 mg/dL (ref 8.7–10.2)
Chloride: 103 mmol/L (ref 96–106)
Creatinine, Ser: 0.61 mg/dL (ref 0.57–1.00)
Globulin, Total: 2.8 g/dL (ref 1.5–4.5)
Glucose: 95 mg/dL (ref 65–99)
Potassium: 4.6 mmol/L (ref 3.5–5.2)
Sodium: 138 mmol/L (ref 134–144)
Total Protein: 7 g/dL (ref 6.0–8.5)
eGFR: 123 mL/min/{1.73_m2} (ref >59)

## 2020-06-05 LAB — CBC WITH DIFFERENTIAL/PLATELET
Basophils Absolute: 0.1 10*3/uL (ref 0.0–0.2)
Basos: 1 %
EOS (ABSOLUTE): 0.2 10*3/uL (ref 0.0–0.4)
Eos: 4 %
Hematocrit: 39.4 % (ref 34.0–46.6)
Hemoglobin: 13.1 g/dL (ref 11.1–15.9)
Immature Grans (Abs): 0 10*3/uL (ref 0.0–0.1)
Immature Granulocytes: 0 %
Lymphocytes Absolute: 1.5 10*3/uL (ref 0.7–3.1)
Lymphs: 26 %
MCH: 28.2 pg (ref 26.6–33.0)
MCHC: 33.2 g/dL (ref 31.5–35.7)
MCV: 85 fL (ref 79–97)
Monocytes Absolute: 0.3 10*3/uL (ref 0.1–0.9)
Monocytes: 6 %
Neutrophils Absolute: 3.5 10*3/uL (ref 1.4–7.0)
Neutrophils: 63 %
Platelets: 264 10*3/uL (ref 150–450)
RBC: 4.64 x10E6/uL (ref 3.77–5.28)
RDW: 12 % (ref 11.7–15.4)
WBC: 5.5 10*3/uL (ref 3.4–10.8)

## 2020-06-05 LAB — LIPID PANEL
Chol/HDL Ratio: 3.1 ratio (ref 0.0–4.4)
Cholesterol, Total: 157 mg/dL (ref 100–199)
HDL: 51 mg/dL (ref >39)
LDL Chol Calc (NIH): 95 mg/dL (ref 0–99)
Triglycerides: 53 mg/dL (ref 0–149)
VLDL Cholesterol Cal: 11 mg/dL (ref 5–40)

## 2020-06-05 LAB — HIV ANTIBODY (ROUTINE TESTING W REFLEX): HIV Screen 4th Generation wRfx: NONREACTIVE

## 2020-06-05 LAB — HEPATITIS C ANTIBODY: Hep C Virus Ab: <0.1 s/co ratio (ref 0.0–0.9)

## 2020-06-24 ENCOUNTER — Other Ambulatory Visit (HOSPITAL_COMMUNITY): Payer: Self-pay

## 2020-06-29 ENCOUNTER — Other Ambulatory Visit (HOSPITAL_COMMUNITY): Payer: Self-pay

## 2020-06-29 ENCOUNTER — Other Ambulatory Visit: Payer: Self-pay | Admitting: Family

## 2020-06-29 MED ORDER — MONTELUKAST SODIUM 10 MG PO TABS
10.0000 mg | ORAL_TABLET | Freq: Every day | ORAL | 3 refills | Status: DC
  Filled 2020-06-29: qty 90, 90d supply, fill #0

## 2020-06-29 MED FILL — Escitalopram Oxalate Tab 10 MG (Base Equiv): ORAL | 90 days supply | Qty: 90 | Fill #0 | Status: AC

## 2020-07-07 ENCOUNTER — Encounter: Payer: Self-pay | Admitting: Family Medicine

## 2020-07-07 ENCOUNTER — Ambulatory Visit (INDEPENDENT_AMBULATORY_CARE_PROVIDER_SITE_OTHER): Payer: No Typology Code available for payment source | Admitting: Family Medicine

## 2020-07-07 VITALS — BP 109/69 | HR 78 | Temp 97.5°F | Ht 64.0 in | Wt 156.0 lb

## 2020-07-07 DIAGNOSIS — B373 Candidiasis of vulva and vagina: Secondary | ICD-10-CM

## 2020-07-07 DIAGNOSIS — N760 Acute vaginitis: Secondary | ICD-10-CM | POA: Diagnosis not present

## 2020-07-07 DIAGNOSIS — B9689 Other specified bacterial agents as the cause of diseases classified elsewhere: Secondary | ICD-10-CM

## 2020-07-07 DIAGNOSIS — R300 Dysuria: Secondary | ICD-10-CM

## 2020-07-07 DIAGNOSIS — N898 Other specified noninflammatory disorders of vagina: Secondary | ICD-10-CM | POA: Diagnosis not present

## 2020-07-07 DIAGNOSIS — B3731 Acute candidiasis of vulva and vagina: Secondary | ICD-10-CM

## 2020-07-07 LAB — URINALYSIS, ROUTINE W REFLEX MICROSCOPIC
Bilirubin, UA: NEGATIVE
Glucose, UA: NEGATIVE
Ketones, UA: NEGATIVE
Nitrite, UA: NEGATIVE
Protein,UA: NEGATIVE
RBC, UA: NEGATIVE
Specific Gravity, UA: 1.02 (ref 1.005–1.030)
Urobilinogen, Ur: 0.2 mg/dL (ref 0.2–1.0)
pH, UA: 7 (ref 5.0–7.5)

## 2020-07-07 LAB — WET PREP FOR TRICH, YEAST, CLUE
Clue Cell Exam: POSITIVE — AB
Trichomonas Exam: NEGATIVE

## 2020-07-07 LAB — MICROSCOPIC EXAMINATION
Bacteria, UA: NONE SEEN
RBC, Urine: NONE SEEN /hpf (ref 0–2)

## 2020-07-07 MED ORDER — FLUCONAZOLE 150 MG PO TABS: 150.0000 mg | ORAL_TABLET | Freq: Once | ORAL | 0 refills | Status: AC

## 2020-07-07 MED ORDER — METRONIDAZOLE 500 MG PO TABS: 500.0000 mg | ORAL_TABLET | Freq: Two times a day (BID) | ORAL | 0 refills | Status: DC

## 2020-07-07 NOTE — Progress Notes (Signed)
Subjective: CC: UTI PCP: Sharion Balloon, FNP [id number] Meredith Hall is Meredith 30 y.o. female presenting to clinic today for:  1. Urinary symptoms Patient reports Meredith 1 day h/o dysuria that started last evening.  This is accompanied by urinary urgency.  Denies urinary frequency, hematuria, fevers, chills, abdominal pain, nausea, vomiting, back pain.  She reports vaginal discharge that is not accompanied by any odor, itching or pelvic pain.  No new sexual partners.  No recent antibiotic use.  Patient has used nothing for symptoms.  Patient denies Meredith h/o frequent or recurrent UTIs.    ROS: Per HPI  Allergies  Allergen Reactions   Shrimp [Shellfish Allergy]     Red, itching rash   Past Medical History:  Diagnosis Date   Eczema     Current Outpatient Medications:    cetirizine (ZYRTEC) 10 MG tablet, Take 1 tablet (10 mg total) by mouth daily., Disp: 90 tablet, Rfl: 3   cholecalciferol (VITAMIN D3) 25 MCG (1000 UNIT) tablet, Take 1,000 Units by mouth daily., Disp: , Rfl:    clindamycin (CLINDAGEL) 1 % gel, Apply topically 2 (two) times daily., Disp: 30 g, Rfl: 11   desonide (DESOWEN) 0.05 % cream, Apply topically 2 (two) times daily., Disp: 180 g, Rfl: 0   escitalopram (LEXAPRO) 10 MG tablet, TAKE 1 TABLET BY MOUTH DAILY., Disp: 90 tablet, Rfl: 3   mometasone (NASONEX) 50 MCG/ACT nasal spray, Place 2 sprays into the nose daily., Disp: 51 g, Rfl: 0   montelukast (SINGULAIR) 10 MG tablet, Take 1 tablet (10 mg total) by mouth at bedtime., Disp: 90 tablet, Rfl: 3   norgestimate-ethinyl estradiol (SPRINTEC 28) 0.25-35 MG-MCG tablet, Take 1 tablet by mouth daily., Disp: 90 tablet, Rfl: 3   Semaglutide,0.25 or 0.5MG /DOS, (OZEMPIC, 0.25 OR 0.5 MG/DOSE,) 2 MG/1.5ML SOPN, Inject 0.25 mg into the skin once Meredith week for 28 days, THEN 0.5 mg once Meredith week for 28 days., Disp: 6 mL, Rfl: 1   triamcinolone cream (KENALOG) 0.5 %, Apply 1 application topically 3 (three) times daily., Disp: 30 g, Rfl: 11 Social  History   Socioeconomic History   Marital status: Single    Spouse name: Not on file   Number of children: Not on file   Years of education: Not on file   Highest education level: Not on file  Occupational History   Not on file  Tobacco Use   Smoking status: Never   Smokeless tobacco: Never  Vaping Use   Vaping Use: Never used  Substance and Sexual Activity   Alcohol use: No   Drug use: No   Sexual activity: Not on file  Other Topics Concern   Not on file  Social History Narrative   Not on file   Social Determinants of Health   Financial Resource Strain: Not on file  Food Insecurity: Not on file  Transportation Needs: Not on file  Physical Activity: Not on file  Stress: Not on file  Social Connections: Not on file  Intimate Partner Violence: Not on file   Family History  Problem Relation Age of Onset   Thyroid disease Mother     Objective: Office vital signs reviewed. BP 109/69   Pulse 78   Temp (!) 97.5 F (36.4 C)   Ht 5\' 4"  (1.626 m)   Wt 156 lb (70.8 kg)   SpO2 97%   BMI 26.78 kg/m   Physical Examination:  General: Awake, alert, well appearing, No acute distress  Assessment/ Plan: 30 y.o.  female   Bacterial vaginosis - Plan: metroNIDAZOLE (FLAGYL) 500 MG tablet  Yeast vaginitis - Plan: fluconazole (DIFLUCAN) 150 MG tablet  Dysuria - Plan: Urinalysis, Routine w reflex microscopic  Vaginal discharge - Plan: WET PREP FOR TRICH, YEAST, CLUE  Wet prep showed positive clue cells as well as many yeast.  There was no evidence of trichomonas.  Her urinalysis showed some leukocytosis but under microscopy no bacteria or significant white blood cells or red blood cells were noted.  I suspect that the leukocytosis was secondary to the after mentioned.  We will treat her with Diflucan.  Take 1 tablet today then repeat in 3 days if still symptomatic.  I have also sent in oral Flagyl and counseled her on avoiding alcohol while taking this orally.  She will  follow-up prn on this issue  Orders Placed This Encounter  Procedures   WET PREP FOR TRICH, YEAST, CLUE   Urinalysis, Routine w reflex microscopic   No orders of the defined types were placed in this encounter.    Janora Norlander, DO [place] [phone]

## 2020-07-07 NOTE — Patient Instructions (Signed)
Bacterial Vaginosis  Bacterial vaginosis is an infection of the vagina. It happens when too many normal germs (healthy bacteria) grow in the vagina. This infection can make it easier to get other infectionsfrom sex (STIs). It is very important for pregnant women to get treated. This infection cancause babies to be born early or at a low birth weight. What are the causes? This infection is caused by an increase in certain germs that grow in [place] You cannot get this infection from toilet seats, bedsheets, swimming pools, orthings that touch your vagina. What increases the risk? Having sex with a new person or more than one person. Having sex without protection. Douching. Having an intrauterine device (IUD). Smoking. Using drugs or drinking alcohol. These can lead you to do things that are risky. Taking certain antibiotic medicines. Being pregnant. What are the signs or symptoms? Some women have no symptoms. Symptoms may include: A discharge from your vagina. It may be gray or white. It can be watery or foamy. A fishy smell. This can happen after sex or during your menstrual period. Itching in and around your vagina. A feeling of burning or pain when you pee (urinate). How is this treated? This infection is treated with antibiotic medicines. These may be given to you as: A pill. A cream for your vagina. A medicine that you put into your vagina (suppository). If the infection comes back after treatment, you may need more antibiotics. Follow these instructions at home: Medicines Take over-the-counter and prescription medicines as told by your doctor. Take or use your antibiotic medicine as told by your doctor. Do not stop taking or using it, even if you start to feel better. General instructions If the person you have sex with is a woman, tell her that you have this infection. She will need to follow up with her doctor. If you have a female partner, he does not need to be treated. Do  not have sex until you finish treatment. Drink enough fluid to keep your pee pale yellow. Keep your vagina and butt clean. Wash the area with warm water each day. Wipe from front to back after you use the toilet. If you are breastfeeding a baby, ask your doctor if you should keep doing so during treatment. Keep all follow-up visits. How is this prevented? Self-care Do not douche. Use only warm water to wash around your vagina. Wear underwear that is cotton or lined with cotton. Do not wear tight pants and pantyhose, especially in the summer. Safe sex Use protection when you have sex. This includes: Use condoms. Use dental dams. This is a thin layer that protects the mouth during oral sex. Limit how many people you have sex with. To prevent this infection, it is best to have sex with just one person. Get tested for STIs. The person you have sex with should also get tested. Drugs and alcohol Do not smoke or use any products that contain nicotine or tobacco. If you need help quitting, ask your doctor. Do not use drugs. Do not drink alcohol if: Your doctor tells you not to drink. You are pregnant, may be pregnant, or are planning to become pregnant. If you drink alcohol: Limit how much you have to 0-1 drink a day. Know how much alcohol is in your drink. In the [place] one drink equals one 12 oz bottle of beer (355 mL), one 5 oz glass of wine (148 mL), or one 1 oz glass of hard liquor (44 mL). Where to find  more information Centers for Disease Control and Prevention: http://www.wolf.info/ American Sexual Health Association: www.ashastd.org Office on [employer]: [place] Contact a doctor if: Your symptoms do not get better, even after you are treated. You have more discharge or pain when you pee. You have a fever or chills. You have pain in your belly (abdomen) or in the area between your hips. You have pain with sex. You bleed from your vagina between menstrual  periods. Summary This infection can happen when too many germs (bacteria) grow in the vagina. This infection can make it easier to get infections from sex (STIs). Treating this can lower that chance. Get treated if you are pregnant. This infection can cause babies to be born early. Do not stop taking or using your antibiotic medicine, even if you start to feel better. This information is not intended to replace advice given to you by your health care provider. Make sure you discuss any questions you have with your healthcare provider. Document Revised: 07/03/2019 Document Reviewed: 07/03/2019 Elsevier Patient Education  [place].  Vaginal Yeast Infection, Adult  Vaginal yeast infection is a condition that causes vaginal discharge as well as soreness, swelling, and redness (inflammation) of the vagina. This is a common condition. Some women get this infectionfrequently. What are the causes? This condition is caused by a change in the normal balance of the yeast (candida) and bacteria that live in the vagina. This change causes an overgrowth ofyeast, which causes the inflammation. What increases the risk? The condition is more likely to develop in women who: Take antibiotic medicines. Have diabetes. Take birth control pills. Are pregnant. Douche often. Have a weak body defense system (immune system). Have been taking steroid medicines for a long time. Frequently wear tight clothing. What are the signs or symptoms? Symptoms of this condition include: White, thick, creamy vaginal discharge. Swelling, itching, redness, and irritation of the vagina. The lips of the vagina (vulva) may be affected as well. Pain or a burning feeling while urinating. Pain during sex. How is this diagnosed? This condition is diagnosed based on: Your medical history. A physical exam. A pelvic exam. Your health care provider will examine a sample of your vaginal discharge under a microscope. Your  health care provider may send this sample for testing to confirm the diagnosis. How is this treated? This condition is treated with medicine. Medicines may be over-the-counter or prescription. You may be told to use one or more of the following: Medicine that is taken by mouth (orally). Medicine that is applied as a cream (topically). Medicine that is inserted directly into the vagina (suppository). Follow these instructions at home:  Lifestyle Do not have sex until your health care provider approves. Tell your sex partner that you have a yeast infection. That person should go to his or her health care provider and ask if they should also be treated. Do not wear tight clothes, such as pantyhose or tight pants. Wear breathable cotton underwear. General instructions Take or apply over-the-counter and prescription medicines only as told by your health care provider. Eat more yogurt. This may help to keep your yeast infection from returning. Do not use tampons until your health care provider approves. Try taking a sitz bath to help with discomfort. This is a warm water bath that is taken while you are sitting down. The water should only come up to your hips and should cover your buttocks. Do this 3-4 times per day or as told by your health care provider.  Do not douche. If you have diabetes, keep your blood sugar levels under control. Keep all follow-up visits as told by your health care provider. This is important. Contact a health care provider if: You have a fever. Your symptoms go away and then return. Your symptoms do not get better with treatment. Your symptoms get worse. You have new symptoms. You develop blisters in or around your vagina. You have blood coming from your vagina and it is not your menstrual period. You develop pain in your abdomen. Summary Vaginal yeast infection is a condition that causes discharge as well as soreness, swelling, and redness (inflammation) of the  vagina. This condition is treated with medicine. Medicines may be over-the-counter or prescription. Take or apply over-the-counter and prescription medicines only as told by your health care provider. Do not douche. Do not have sex or use tampons until your health care provider approves. Contact a health care provider if your symptoms do not get better with treatment or your symptoms go away and then return. This information is not intended to replace advice given to you by your health care provider. Make sure you discuss any questions you have with your healthcare provider. Document Revised: 12/24/2019 Document Reviewed: 05/21/2017 Elsevier Patient Education  [place].

## 2020-07-14 ENCOUNTER — Encounter: Payer: Self-pay | Admitting: Family Medicine

## 2020-07-15 ENCOUNTER — Other Ambulatory Visit: Payer: Self-pay | Admitting: Family

## 2020-07-15 MED ORDER — FLUCONAZOLE 150 MG PO TABS: 150.0000 mg | ORAL_TABLET | ORAL | 0 refills | Status: DC | PRN

## 2020-07-15 MED ORDER — METRONIDAZOLE 500 MG PO TABS: 500.0000 mg | ORAL_TABLET | Freq: Two times a day (BID) | ORAL | 0 refills | Status: DC

## 2020-07-20 ENCOUNTER — Other Ambulatory Visit (HOSPITAL_COMMUNITY): Payer: Self-pay

## 2020-09-11 ENCOUNTER — Other Ambulatory Visit (HOSPITAL_COMMUNITY): Payer: Self-pay

## 2020-09-11 MED ORDER — CARESTART COVID-19 HOME TEST VI KIT
PACK | 0 refills | Status: DC
  Filled 2020-09-11: qty 4, 4d supply, fill #0

## 2020-10-15 ENCOUNTER — Ambulatory Visit (INDEPENDENT_AMBULATORY_CARE_PROVIDER_SITE_OTHER): Payer: No Typology Code available for payment source | Admitting: Family

## 2020-10-15 ENCOUNTER — Encounter: Payer: Self-pay | Admitting: Family

## 2020-10-15 DIAGNOSIS — S01332A Puncture wound without foreign body of left ear, initial encounter: Secondary | ICD-10-CM | POA: Diagnosis not present

## 2020-10-15 MED ORDER — AMOXICILLIN-POT CLAVULANATE 875-125 MG PO TABS: 1.0000 | ORAL_TABLET | Freq: Two times a day (BID) | ORAL | 0 refills | Status: DC

## 2020-10-15 MED ORDER — MUPIROCIN CALCIUM 2 % EX CREA: 1.0000 "application " | TOPICAL_CREAM | Freq: Two times a day (BID) | CUTANEOUS | 0 refills | Status: DC

## 2020-10-15 NOTE — Progress Notes (Signed)
Virtual Visit Consent   Meredith Hall, you are scheduled for a virtual visit with a [facility] provider today.     Just as with appointments in the office, your consent must be obtained to participate.  Your consent will be active for this visit and any virtual visit you may have with one of our providers in the next 365 days.     If you have a MyChart account, a copy of this consent can be sent to you electronically.  All virtual visits are billed to your insurance company just like a traditional visit in the office.    As this is a virtual visit, video technology does not allow for your provider to perform a traditional examination.  This may limit your provider's ability to fully assess your condition.  If your provider identifies any concerns that need to be evaluated in person or the need to arrange testing (such as labs, EKG, etc.), we will make arrangements to do so.     Although advances in technology are sophisticated, we cannot ensure that it will always work on either your end or our end.  If the connection with a video visit is poor, the visit may have to be switched to a telephone visit.  With either a video or telephone visit, we are not always able to ensure that we have a secure connection.     I need to obtain your verbal consent now.   Are you willing to proceed with your visit today?    Meredith Hall has provided verbal consent on 10/15/2020 for a virtual visit (video or telephone).   Jannifer Rodney, FNP   Date: 10/15/2020 12:39 PM   Virtual Visit via Video Note   I, Jannifer Rodney, connected with  Meredith Hall  ([id number], 11-11-1990) on 10/15/20 at  2:55 PM EDT by a video-enabled telemedicine application and verified that I am speaking with the correct person using two identifiers.  Location: Patient: Virtual Visit Location Patient: Home Provider: Virtual Visit Location Provider: Office/Clinic   I discussed the limitations of evaluation and  management by telemedicine and the availability of in person appointments. The patient expressed understanding and agreed to proceed.    History of Present Illness: Meredith Hall is a 30 y.o. who identifies as a female who was assigned female at birth, and is being seen today for left ear ear piercing infection. She reports she had this done about a month and half ago. It was doing well, but over the last day or so she has noticed redness, clear drainage, and a "bump" around her ear ring. She reports mild aching pain of 4-5 out 10.    HPI: HPI  Problems:  Patient Active Problem List   Diagnosis Date Noted   GAD (generalized anxiety disorder) 06/04/2020   Depression, major, single episode, mild (HCC) 06/04/2020   Irritable bowel syndrome with constipation 04/29/2019   Overweight (BMI 25.0-29.9) 04/29/2019   Vitamin D deficiency 07/18/2018   Cystic acne 09/30/2015   Pain in joint, lower leg 10/15/2012   Eczema 05/21/2012    Allergies:  Allergies  Allergen Reactions   Shrimp [Shellfish Allergy]     Red, itching rash   Medications:  Current Outpatient Medications:    amoxicillin-clavulanate (AUGMENTIN) 875-125 MG tablet, Take 1 tablet by mouth 2 (two) times daily., Disp: 14 tablet, Rfl: 0   mupirocin cream (BACTROBAN) 2 %, Apply 1 application topically 2 (two) times daily., Disp: 30 g, Rfl: 0  cetirizine (ZYRTEC) 10 MG tablet, Take 1 tablet (10 mg total) by mouth daily., Disp: 90 tablet, Rfl: 3   cholecalciferol (VITAMIN D3) 25 MCG (1000 UNIT) tablet, Take 1,000 Units by mouth daily., Disp: , Rfl:    clindamycin (CLINDAGEL) 1 % gel, Apply topically 2 (two) times daily., Disp: 30 g, Rfl: 11   COVID-19 At Home Antigen Test (CARESTART COVID-19 HOME TEST) KIT, use as directed within package instructions, Disp: 4 each, Rfl: 0   desonide (DESOWEN) 0.05 % cream, Apply topically 2 (two) times daily., Disp: 180 g, Rfl: 0   escitalopram (LEXAPRO) 10 MG tablet, TAKE 1 TABLET BY MOUTH  DAILY., Disp: 90 tablet, Rfl: 3   fluconazole (DIFLUCAN) 150 MG tablet, Take 1 tablet (150 mg total) by mouth every three (3) days as needed., Disp: 3 tablet, Rfl: 0   metroNIDAZOLE (FLAGYL) 500 MG tablet, Take 1 tablet (500 mg total) by mouth 2 (two) times daily., Disp: 14 tablet, Rfl: 0   mometasone (NASONEX) 50 MCG/ACT nasal spray, Place 2 sprays into the nose daily., Disp: 51 g, Rfl: 0   montelukast (SINGULAIR) 10 MG tablet, Take 1 tablet (10 mg total) by mouth at bedtime., Disp: 90 tablet, Rfl: 3   norgestimate-ethinyl estradiol (SPRINTEC 28) 0.25-35 MG-MCG tablet, Take 1 tablet by mouth daily., Disp: 90 tablet, Rfl: 3   triamcinolone cream (KENALOG) 0.5 %, Apply 1 application topically 3 (three) times daily., Disp: 30 g, Rfl: 11  Observations/Objective: No SOB Or distress noted   Assessment and Plan: 1. Complication of left ear piercing, initial encounter - mupirocin cream (BACTROBAN) 2 %; Apply 1 application topically 2 (two) times daily.  Dispense: 30 g; Refill: 0 - amoxicillin-clavulanate (AUGMENTIN) 875-125 MG tablet; Take 1 tablet by mouth 2 (two) times daily.  Dispense: 14 tablet; Refill: 0 Keep clean and dry Do not pick or squeeze area  Report any worsening of symptoms or no improvements.   Follow Up Instructions: I discussed the assessment and treatment plan with the patient. The patient was provided an opportunity to ask questions and all were answered. The patient agreed with the plan and demonstrated an understanding of the instructions.  A copy of instructions were sent to the patient via MyChart unless otherwise noted below.     The patient was advised to call back or seek an in-person evaluation if the symptoms worsen or if the condition fails to improve as anticipated.  Time:  I spent 11 minutes with the patient via telehealth technology discussing the above problems/concerns.    Evelina Dun, FNP

## 2020-10-15 NOTE — Addendum Note (Signed)
Addended by: Evelina Dun A on: 10/15/2020 01:00 PM   Modules accepted: Orders

## 2020-10-20 IMAGING — DX DG CHEST 2V
2 series · 2 of 2 positions shown · non-contrast
Comparison: None.

CLINICAL DATA: COVID

EXAM:
CHEST - 2 VIEW

[chest pa]
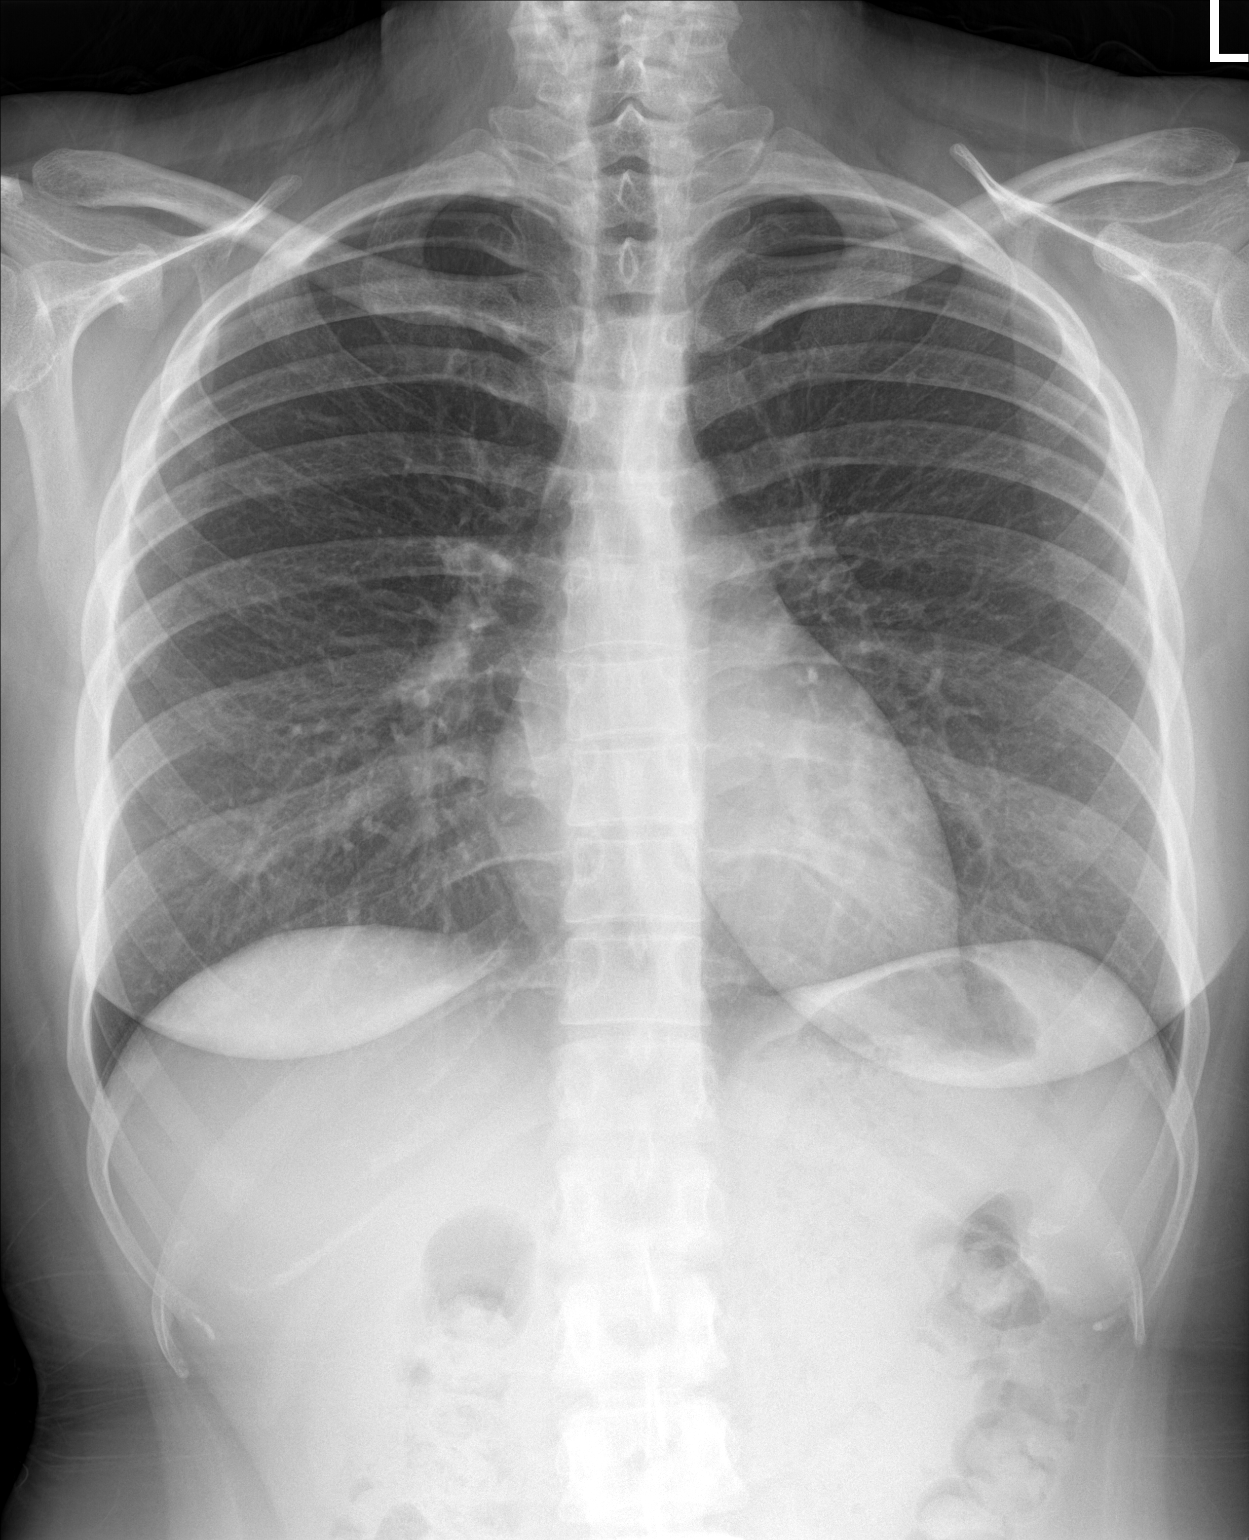

[chest lat]
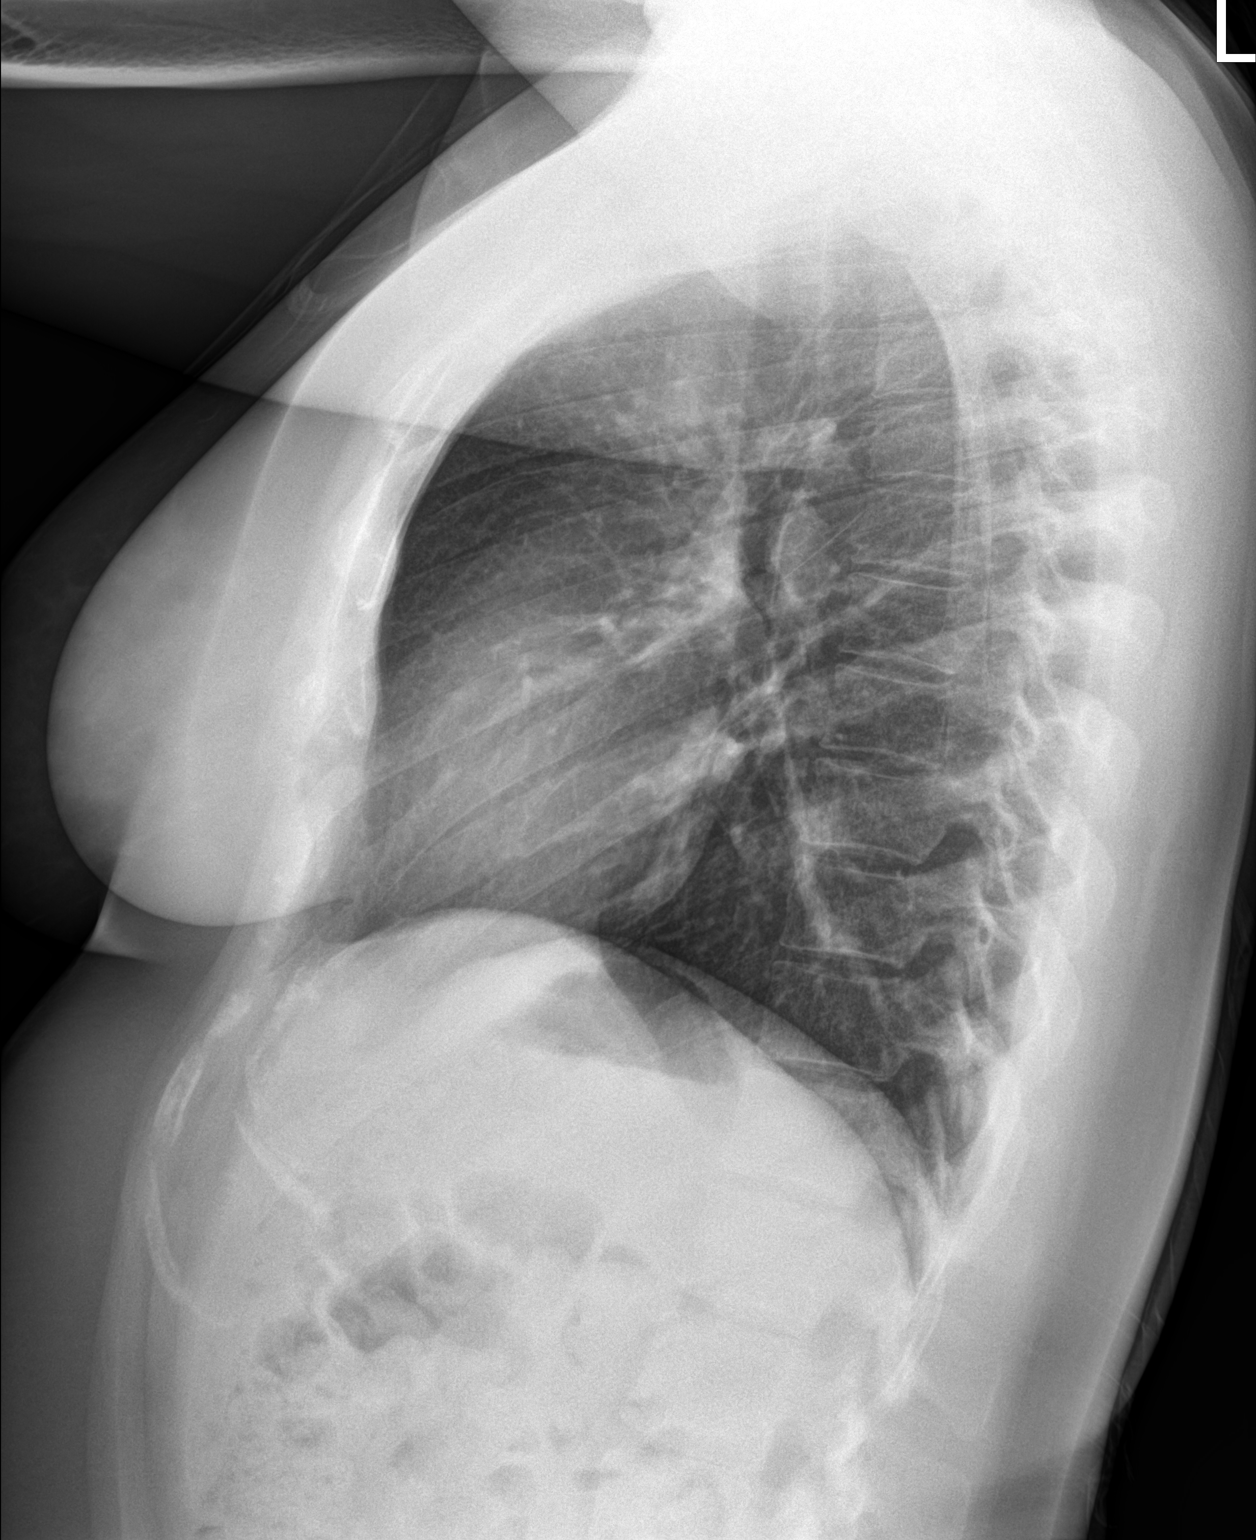

[2 of 2 positions shown; findings below may reference images not displayed]

FINDINGS: The heart size and mediastinal contours are within normal limits.
Both lungs are clear. The visualized skeletal structures are
unremarkable.
IMPRESSION: No active cardiopulmonary disease.

## 2020-10-29 ENCOUNTER — Ambulatory Visit: Payer: No Typology Code available for payment source | Admitting: Family

## 2020-11-02 ENCOUNTER — Other Ambulatory Visit: Payer: Self-pay | Admitting: *Deleted

## 2020-11-02 DIAGNOSIS — J301 Allergic rhinitis due to pollen: Secondary | ICD-10-CM

## 2020-11-02 MED ORDER — MOMETASONE FUROATE 50 MCG/ACT NA SUSP: 2.0000 | Freq: Every day | NASAL | 0 refills | Status: DC

## 2020-11-02 NOTE — Telephone Encounter (Signed)
Pt wants just a 1 mos to [facility]

## 2020-11-03 ENCOUNTER — Encounter: Payer: Self-pay | Admitting: Family Medicine

## 2020-11-03 ENCOUNTER — Ambulatory Visit (INDEPENDENT_AMBULATORY_CARE_PROVIDER_SITE_OTHER): Payer: No Typology Code available for payment source | Admitting: Family Medicine

## 2020-11-03 DIAGNOSIS — J069 Acute upper respiratory infection, unspecified: Secondary | ICD-10-CM

## 2020-11-03 DIAGNOSIS — M791 Myalgia, unspecified site: Secondary | ICD-10-CM

## 2020-11-03 DIAGNOSIS — R509 Fever, unspecified: Secondary | ICD-10-CM | POA: Diagnosis not present

## 2020-11-03 LAB — VERITOR FLU A/B WAIVED
Influenza A: NEGATIVE
Influenza B: NEGATIVE

## 2020-11-03 MED ORDER — PSEUDOEPH-BROMPHEN-DM 30-2-10 MG/5ML PO SYRP: 5.0000 mL | ORAL_SOLUTION | Freq: Four times a day (QID) | ORAL | 0 refills | Status: DC | PRN

## 2020-11-03 NOTE — Progress Notes (Signed)
Virtual Visit via telephone Note Due to COVID-19 pandemic this visit was conducted virtually. This visit type was conducted due to national recommendations for restrictions regarding the COVID-19 Pandemic (e.g. social distancing, sheltering in place) in an effort to limit this patient's exposure and mitigate transmission in our community. All issues noted in this document were discussed and addressed.  A physical exam was not performed with this format.   I connected with Meredith Hall on 11/03/2020 at [place] by telephone and verified that I am speaking with the correct person using two identifiers. Meredith Hall is currently located at home and  no one  is currently with them during visit. The provider, Monia Pouch, FNP is located in their office at time of visit.  I discussed the limitations, risks, security and privacy concerns of performing an evaluation and management service by telephone and the availability of in person appointments. I also discussed with the patient that there may be a patient responsible charge related to this service. The patient expressed understanding and agreed to proceed.  Subjective:  Patient ID: Meredith Hall, female    DOB: 1990-01-17, 30 y.o.   MRN: [id number]  Chief Complaint:  Fever, Cough, URI, and Sore Throat   HPI: [name] is a 30 y.o. female presenting on 11/03/2020 for Fever, Cough, URI, and Sore Throat   Fever  This is a new problem. The current episode started in the past 7 days. The problem occurs constantly. The problem has been unchanged. The maximum temperature noted was 101 to 101.9 F. The temperature was taken using an oral thermometer. Associated symptoms include congestion, coughing, headaches, muscle aches and a sore throat. Pertinent negatives include no abdominal pain, chest pain, diarrhea, ear pain, nausea, rash, sleepiness, urinary pain, vomiting or wheezing. She has tried acetaminophen for the symptoms.  The treatment provided mild relief.  Cough This is a new problem. The current episode started in the past 7 days. The problem has been unchanged. The problem occurs constantly. The cough is Non-productive. Associated symptoms include chills, a fever, headaches, myalgias, nasal congestion, rhinorrhea and a sore throat. Pertinent negatives include no chest pain, ear congestion, ear pain, heartburn, hemoptysis, postnasal drip, rash, shortness of breath, sweats, weight loss or wheezing. Nothing aggravates the symptoms. Risk factors for lung disease include occupational exposure. She has tried nothing for the symptoms.  URI  This is a new problem. The current episode started in the past 7 days. The problem has been unchanged. The maximum temperature recorded prior to her arrival was 101 - 101.9 F. Associated symptoms include congestion, coughing, headaches, rhinorrhea, a sore throat and swollen glands. Pertinent negatives include no abdominal pain, chest pain, diarrhea, dysuria, ear pain, joint pain, joint swelling, nausea, neck pain, plugged ear sensation, rash, sinus pain, sneezing, vomiting or wheezing. She has tried acetaminophen for the symptoms. The treatment provided mild relief.  Sore Throat  This is a new problem. The current episode started in the past 7 days. The problem has been unchanged. The maximum temperature recorded prior to her arrival was 101 - 101.9 F. The pain is at a severity of 4/10. The pain is mild. Associated symptoms include congestion, coughing, headaches and swollen glands. Pertinent negatives include no abdominal pain, diarrhea, drooling, ear discharge, ear pain, hoarse voice, plugged ear sensation, neck pain, shortness of breath, stridor, trouble swallowing or vomiting. She has tried acetaminophen for the symptoms. The treatment provided mild relief.    Relevant past medical, surgical, family,  and social history reviewed and updated as indicated.  Allergies and medications  reviewed and updated.   Past Medical History:  Diagnosis Date   Eczema     History reviewed. No pertinent surgical history.  Social History   Socioeconomic History   Marital status: Single    Spouse name: Not on file   Number of children: Not on file   Years of education: Not on file   Highest education level: Not on file  Occupational History   Not on file  Tobacco Use   Smoking status: Never   Smokeless tobacco: Never  Vaping Use   Vaping Use: Never used  Substance and Sexual Activity   Alcohol use: No   Drug use: No   Sexual activity: Not on file  Other Topics Concern   Not on file  Social History Narrative   Not on file   Social Determinants of Health   Financial Resource Strain: Not on file  Food Insecurity: Not on file  Transportation Needs: Not on file  Physical Activity: Not on file  Stress: Not on file  Social Connections: Not on file  Intimate Partner Violence: Not on file    Outpatient Encounter Medications as of 11/03/2020  Medication Sig   brompheniramine-pseudoephedrine-DM 30-2-10 MG/5ML syrup Take 5 mLs by mouth 4 (four) times daily as needed.   amoxicillin-clavulanate (AUGMENTIN) 875-125 MG tablet Take 1 tablet by mouth 2 (two) times daily.   cetirizine (ZYRTEC) 10 MG tablet Take 1 tablet (10 mg total) by mouth daily.   cholecalciferol (VITAMIN D3) 25 MCG (1000 UNIT) tablet Take 1,000 Units by mouth daily.   clindamycin (CLINDAGEL) 1 % gel Apply topically 2 (two) times daily.   COVID-19 At Home Antigen Test [facility] COVID-19 HOME TEST) KIT use as directed within package instructions   desonide (DESOWEN) 0.05 % cream Apply topically 2 (two) times daily.   escitalopram (LEXAPRO) 10 MG tablet TAKE 1 TABLET BY MOUTH DAILY.   fluconazole (DIFLUCAN) 150 MG tablet Take 1 tablet (150 mg total) by mouth every three (3) days as needed.   metroNIDAZOLE (FLAGYL) 500 MG tablet Take 1 tablet (500 mg total) by mouth 2 (two) times daily.   mometasone  (NASONEX) 50 MCG/ACT nasal spray Place 2 sprays into the nose daily.   montelukast (SINGULAIR) 10 MG tablet Take 1 tablet (10 mg total) by mouth at bedtime.   mupirocin cream (BACTROBAN) 2 % Apply 1 application topically 2 (two) times daily.   norgestimate-ethinyl estradiol (SPRINTEC 28) 0.25-35 MG-MCG tablet Take 1 tablet by mouth daily.   triamcinolone cream (KENALOG) 0.5 % Apply 1 application topically 3 (three) times daily.   No facility-administered encounter medications on file as of 11/03/2020.    Allergies  Allergen Reactions   Shrimp [Shellfish Allergy]     Red, itching rash    Review of Systems  Constitutional:  Positive for activity change, appetite change, chills, fatigue and fever. Negative for weight loss.  HENT:  Positive for congestion, rhinorrhea and sore throat. Negative for drooling, ear discharge, ear pain, hoarse voice, postnasal drip, sinus pain, sneezing and trouble swallowing.   Respiratory:  Positive for cough. Negative for hemoptysis, shortness of breath, wheezing and stridor.   Cardiovascular:  Negative for chest pain.  Gastrointestinal:  Negative for abdominal pain, diarrhea, heartburn, nausea and vomiting.  Genitourinary:  Negative for decreased urine volume, difficulty urinating and dysuria.  Musculoskeletal:  Positive for arthralgias and myalgias. Negative for joint pain and neck pain.  Skin:  Negative  for rash.  Neurological:  Positive for headaches.  Psychiatric/Behavioral:  Negative for confusion.   All other systems reviewed and are negative.       Observations/Objective: No vital signs or physical exam, this was a telephone or virtual health encounter.  Pt alert and oriented, answers all questions appropriately, and able to speak in full sentences.    Assessment and Plan: Meredith Hall was seen today for fever, cough, uri and sore throat.  Diagnoses and all orders for this visit:  URI with cough and congestion Fever and chills Myalgia  Pt has  COVID-19 test pending. Will also check for influenza. Symptomatic care discussed in detail. Tylenol and motrin as needed for fever and pain control. Bromfed as needed for cough and congestion. Adequate hydration and rest. Report any new, worsening, or persistent symptoms.  -     Veritor Flu A/B Waived -     brompheniramine-pseudoephedrine-DM 30-2-10 MG/5ML syrup; Take 5 mLs by mouth 4 (four) times daily as needed.   Follow Up Instructions: Return if symptoms worsen or fail to improve.    I discussed the assessment and treatment plan with the patient. The patient was provided an opportunity to ask questions and all were answered. The patient agreed with the plan and demonstrated an understanding of the instructions.   The patient was advised to call back or seek an in-person evaluation if the symptoms worsen or if the condition fails to improve as anticipated.  The above assessment and management plan was discussed with the patient. The patient verbalized understanding of and has agreed to the management plan. Patient is aware to call the clinic if they develop any new symptoms or if symptoms persist or worsen. Patient is aware when to return to the clinic for a follow-up visit. Patient educated on when it is appropriate to go to the emergency department.    I provided 11 minutes of non-face-to-face time during this encounter. The call started at [place]. The call ended at 0830. The other time was used for coordination of care.    Monia Pouch, FNP-C [facility] [address] [place], [place] [address] [phone] 11/03/2020

## 2020-12-30 ENCOUNTER — Other Ambulatory Visit (HOSPITAL_COMMUNITY): Payer: Self-pay

## 2021-01-24 ENCOUNTER — Other Ambulatory Visit: Payer: Self-pay | Admitting: Family Medicine

## 2021-01-24 DIAGNOSIS — F411 Generalized anxiety disorder: Secondary | ICD-10-CM

## 2021-01-24 MED ORDER — ESCITALOPRAM OXALATE 10 MG PO TABS: ORAL_TABLET | Freq: Every day | ORAL | 0 refills | Status: DC

## 2021-02-16 ENCOUNTER — Ambulatory Visit (INDEPENDENT_AMBULATORY_CARE_PROVIDER_SITE_OTHER): Payer: No Typology Code available for payment source | Admitting: Family Medicine

## 2021-02-16 ENCOUNTER — Encounter: Payer: Self-pay | Admitting: Family Medicine

## 2021-02-16 VITALS — BP 127/69 | HR 79 | Temp 98.2°F | Ht 64.0 in | Wt 155.0 lb

## 2021-02-16 DIAGNOSIS — R591 Generalized enlarged lymph nodes: Secondary | ICD-10-CM

## 2021-02-16 NOTE — Progress Notes (Addendum)
Subjective:  Patient ID: Meredith Hall, female    DOB: [birth year], 31 y.o.   MRN: [id number]  Patient Care Team: Sharion Balloon, FNP as PCP - General (Family Medicine)   Chief Complaint:  knot on neck   HPI: Meredith Hall is a 31 y.o. female presenting on 02/16/2021 for knot on neck   Pt presents with lymphadenopathy of the left posterior cervical chain and left submandibular. Denies fever, chills, or recent illness. No weight loss, fatigue, or night sweats.     Relevant past medical, surgical, family, and social history reviewed and updated as indicated.  Allergies and medications reviewed and updated. Data reviewed: Chart in Epic.   Past Medical History:  Diagnosis Date   Eczema     History reviewed. No pertinent surgical history.  Social History   Socioeconomic History   Marital status: Single    Spouse name: Not on file   Number of children: Not on file   Years of education: Not on file   Highest education level: Not on file  Occupational History   Not on file  Tobacco Use   Smoking status: Never   Smokeless tobacco: Never  Vaping Use   Vaping Use: Never used  Substance and Sexual Activity   Alcohol use: No   Drug use: No   Sexual activity: Not on file  Other Topics Concern   Not on file  Social History Narrative   Not on file   Social Determinants of Health   Financial Resource Strain: Not on file  Food Insecurity: Not on file  Transportation Needs: Not on file  Physical Activity: Not on file  Stress: Not on file  Social Connections: Not on file  Intimate Partner Violence: Not on file    Outpatient Encounter Medications as of 02/16/2021  Medication Sig   cetirizine (ZYRTEC) 10 MG tablet Take 1 tablet (10 mg total) by mouth daily.   cholecalciferol (VITAMIN D3) 25 MCG (1000 UNIT) tablet Take 1,000 Units by mouth daily.   clindamycin (CLINDAGEL) 1 % gel Apply topically 2 (two) times daily.   escitalopram (LEXAPRO) 10 MG tablet  TAKE 1 TABLET BY MOUTH DAILY.   mometasone (NASONEX) 50 MCG/ACT nasal spray Place 2 sprays into the nose daily.   montelukast (SINGULAIR) 10 MG tablet Take 1 tablet (10 mg total) by mouth at bedtime.   mupirocin cream (BACTROBAN) 2 % Apply 1 application topically 2 (two) times daily.   norgestimate-ethinyl estradiol (SPRINTEC 28) 0.25-35 MG-MCG tablet Take 1 tablet by mouth daily.   triamcinolone cream (KENALOG) 0.5 % Apply 1 application topically 3 (three) times daily.   [DISCONTINUED] amoxicillin-clavulanate (AUGMENTIN) 875-125 MG tablet Take 1 tablet by mouth 2 (two) times daily.   [DISCONTINUED] brompheniramine-pseudoephedrine-DM 30-2-10 MG/5ML syrup Take 5 mLs by mouth 4 (four) times daily as needed.   [DISCONTINUED] COVID-19 At Home Antigen Test [facility] COVID-19 HOME TEST) KIT use as directed within package instructions   [DISCONTINUED] desonide (DESOWEN) 0.05 % cream Apply topically 2 (two) times daily.   [DISCONTINUED] fluconazole (DIFLUCAN) 150 MG tablet Take 1 tablet (150 mg total) by mouth every three (3) days as needed.   [DISCONTINUED] metroNIDAZOLE (FLAGYL) 500 MG tablet Take 1 tablet (500 mg total) by mouth 2 (two) times daily.   No facility-administered encounter medications on file as of 02/16/2021.    Allergies  Allergen Reactions   Shrimp [Shellfish Allergy]     Red, itching rash    Review of Systems  Constitutional:  Negative for activity change, appetite change, chills, diaphoresis, fatigue, fever and unexpected weight change.  HENT:  Negative for congestion, sinus pain and sore throat.   Respiratory:  Negative for cough and shortness of breath.   Cardiovascular:  Negative for chest pain, palpitations and leg swelling.  Gastrointestinal:  Negative for abdominal pain.  Genitourinary:  Negative for decreased urine volume and difficulty urinating.  Musculoskeletal:  Negative for arthralgias, myalgias and neck pain.  Neurological:  Negative for headaches.   Hematological:  Positive for adenopathy.  Psychiatric/Behavioral:  Negative for confusion.   All other systems reviewed and are negative.      Objective:  BP 127/69    Pulse 79    Temp 98.2 F (36.8 C) (Temporal)    Ht _0  (1.626 m)    Wt 155 lb (70.3 kg)    SpO2 97%    BMI 26.61 kg/m    Wt Readings from Last 3 Encounters:  02/16/21 155 lb (70.3 kg)  07/07/20 156 lb (70.8 kg)  06/04/20 156 lb (70.8 kg)    Physical Exam Vitals and nursing note reviewed.  Constitutional:      Appearance: Normal appearance. She is normal weight.  HENT:     Head: Normocephalic and atraumatic.     Right Ear: Hearing, ear canal and external ear normal. A middle ear effusion is present. Tympanic membrane is not erythematous.     Left Ear: Hearing, ear canal and external ear normal. A middle ear effusion is present. Tympanic membrane is not erythematous.     Nose: Nose normal.     Mouth/Throat:     Mouth: Mucous membranes are moist.     Pharynx: Oropharynx is clear. No oropharyngeal exudate or posterior oropharyngeal erythema.  Eyes:     Conjunctiva/sclera: Conjunctivae normal.     Pupils: Pupils are equal, round, and reactive to light.  Neck:   Cardiovascular:     Rate and Rhythm: Normal rate and regular rhythm.     Pulses: Normal pulses.     Heart sounds: Normal heart sounds.  Pulmonary:     Effort: Pulmonary effort is normal.     Breath sounds: Normal breath sounds.  Musculoskeletal:        General: Normal range of motion.     Cervical back: Normal range of motion and neck supple.  Lymphadenopathy:     Cervical: Cervical adenopathy present.  Skin:    General: Skin is warm and dry.  Neurological:     General: No focal deficit present.     Mental Status: She is alert and oriented to person, place, and time. Mental status is at baseline.  Psychiatric:        Mood and Affect: Mood normal.        Behavior: Behavior normal.        Thought Content: Thought content normal.         Judgment: Judgment normal.    Results for orders placed or performed in visit on 11/03/20  Veritor Flu A/B Waived  Result Value Ref Range   Influenza A Negative Negative   Influenza B Negative Negative       Pertinent labs & imaging results that were available during my care of the patient were reviewed by me and considered in my medical decision making.  Assessment & Plan:  Meredith Hall was seen today for knot on neck.  Diagnoses and all orders for this visit:  Lymphadenopathy No indications of viral or bacterial illness, no red flags  concerning for malignancy. Patient instructed to increase immune support including vitamin C and Zinc. Follow up if nodes remain swollen or if any new symptoms develop. Symptomatic care discussed in detail.   Continue all other maintenance medications.  Follow up plan: Return if symptoms worsen or fail to improve.   Continue healthy lifestyle choices, including diet (rich in fruits, vegetables, and lean proteins, and low in salt and simple carbohydrates) and exercise (at least 30 minutes of moderate physical activity daily).  Educational handout given for lymphadenaopathy.   The above assessment and management plan was discussed with the patient. The patient verbalized understanding of and has agreed to the management plan. Patient is aware to call the clinic if they develop any new symptoms or if symptoms persist or worsen. Patient is aware when to return to the clinic for a follow-up visit. Patient educated on when it is appropriate to go to the emergency department.   Sherlynn Carbon Azayla Polo NP-S  I personally was present during the history, physical exam, and medical decision-making activities of this visit and have verified that the services and findings are accurately documented in the nurse practitioner student's note.  Monia Pouch, FNP-C [facility] [address] [place], [place] [address] [phone]

## 2021-02-24 ENCOUNTER — Telehealth: Payer: Self-pay | Admitting: *Deleted

## 2021-02-24 ENCOUNTER — Other Ambulatory Visit: Payer: Self-pay | Admitting: Family Medicine

## 2021-02-24 DIAGNOSIS — Z8619 Personal history of other infectious and parasitic diseases: Secondary | ICD-10-CM

## 2021-02-24 DIAGNOSIS — R591 Generalized enlarged lymph nodes: Secondary | ICD-10-CM

## 2021-02-24 MED ORDER — FLUCONAZOLE 150 MG PO TABS
150.0000 mg | ORAL_TABLET | Freq: Once | ORAL | 0 refills | Status: AC
Start: 2021-02-24 — End: 2021-02-24

## 2021-02-24 MED ORDER — AMOXICILLIN-POT CLAVULANATE 875-125 MG PO TABS: 1.0000 | ORAL_TABLET | Freq: Two times a day (BID) | ORAL | 0 refills | Status: AC

## 2021-02-24 NOTE — Telephone Encounter (Signed)
Pt states her lymph nodes are still swollen.

## 2021-02-24 NOTE — Telephone Encounter (Signed)
RX sent, pt aware

## 2021-03-17 ENCOUNTER — Other Ambulatory Visit: Payer: Self-pay | Admitting: Family Medicine

## 2021-03-17 ENCOUNTER — Other Ambulatory Visit: Payer: No Typology Code available for payment source

## 2021-03-17 DIAGNOSIS — R599 Enlarged lymph nodes, unspecified: Secondary | ICD-10-CM

## 2021-03-17 DIAGNOSIS — R591 Generalized enlarged lymph nodes: Secondary | ICD-10-CM

## 2021-03-17 LAB — CBC WITH DIFFERENTIAL/PLATELET
Basophils Absolute: 0 10*3/uL (ref 0.0–0.2)
Basos: 1 %
EOS (ABSOLUTE): 0.1 10*3/uL (ref 0.0–0.4)
Eos: 3 %
Hematocrit: 39.5 % (ref 34.0–46.6)
Hemoglobin: 13.4 g/dL (ref 11.1–15.9)
Immature Grans (Abs): 0 10*3/uL (ref 0.0–0.1)
Immature Granulocytes: 0 %
Lymphocytes Absolute: 1.5 10*3/uL (ref 0.7–3.1)
Lymphs: 27 %
MCH: 27.6 pg (ref 26.6–33.0)
MCHC: 33.9 g/dL (ref 31.5–35.7)
MCV: 81 fL (ref 79–97)
Monocytes Absolute: 0.4 10*3/uL (ref 0.1–0.9)
Monocytes: 8 %
Neutrophils Absolute: 3.4 10*3/uL (ref 1.4–7.0)
Neutrophils: 61 %
Platelets: 240 10*3/uL (ref 150–450)
RBC: 4.86 x10E6/uL (ref 3.77–5.28)
RDW: 12.1 % (ref 11.7–15.4)
WBC: 5.5 10*3/uL (ref 3.4–10.8)

## 2021-03-17 NOTE — Progress Notes (Signed)
Labs order

## 2021-03-18 LAB — MONO QUAL W/RFLX QN: Mono Qual W/Rflx Qn: NEGATIVE

## 2021-05-04 ENCOUNTER — Telehealth: Payer: Self-pay | Admitting: *Deleted

## 2021-05-04 ENCOUNTER — Ambulatory Visit: Payer: No Typology Code available for payment source | Admitting: Obstetrics & Gynecology

## 2021-05-04 ENCOUNTER — Encounter: Payer: Self-pay | Admitting: Obstetrics & Gynecology

## 2021-05-04 VITALS — BP 124/83 | HR 82 | Ht 65.0 in | Wt 143.4 lb

## 2021-05-04 DIAGNOSIS — O200 Threatened abortion: Secondary | ICD-10-CM

## 2021-05-04 NOTE — Telephone Encounter (Signed)
Patient states she noticed yesterday that she was having light bleeding when wiping after using the bathroom. This morning, the bleeding has increased and is now passing clots.  Advised patient to come to office for visit and evaluation.  Agreeable to come.  ?

## 2021-05-04 NOTE — Progress Notes (Signed)
? ?  GYN VISIT ?Patient name: Meredith Hall MRN [id number]  Date of birth: 08-28-1990 ?Chief Complaint:   ?+ UPT on 4/12 (Pt started spotting/bleeding yesterday; + cramps) ? ?History of Present Illness:   ?CHENEE Hall is a 31 y.o. G1P0 @ 77w4dby LMP of 03/19/21 female being seen today for vaginal bleeding/threatened miscarriage.    ? ?Vaginal bleeding- light spotting and now reporting clots.  Notes quarter-sized clots.  Mild cramping.  No fever or chills.  Reports no other acute complaints or concerns. ? ?Patient's last menstrual period was 03/19/2021. ? ? ?  02/16/2021  ? 11:34 AM 07/07/2020  ?  9:04 AM 06/04/2020  ? 12:25 PM 06/04/2020  ? 12:15 PM 05/07/2020  ? 11:41 AM  ?Depression screen PHQ 2/9  ?Decreased Interest 0 0 0 0 0  ?Down, Depressed, Hopeless 0 0 0 1 0  ?PHQ - 2 Score 0 0 0 1 0  ?Altered sleeping  0 0    ?Tired, decreased energy  0 0    ?Change in appetite  0 0    ?Feeling bad or failure about yourself   0 0    ?Trouble concentrating  0 0    ?Moving slowly or fidgety/restless  0 0    ?Suicidal thoughts  0 0    ?PHQ-9 Score  0 0    ?Difficult doing work/chores  Not difficult at all Not difficult at all    ? ? ? ?Review of Systems:   ?Pertinent items are noted in HPI ?Denies fever/chills, dizziness, headaches, visual disturbances, fatigue, shortness of breath, chest pain, vomiting, no problems with periods, bowel movements, urination, or intercourse unless otherwise stated above.  ?Pertinent History Reviewed:  ?Reviewed past medical,surgical, social, obstetrical and family history.  ?Reviewed problem list, medications and allergies. ?Physical Assessment:  ? ?Vitals:  ? 05/04/21 1151  ?BP: 124/83  ?Pulse: 82  ?Weight: 143 lb 6.4 oz (65 kg)  ?Height: '5\' 5"'$  (1.651 m)  ?Body mass index is 23.86 kg/m?. ? ?     Physical Examination:  ? General appearance: alert, well appearing, and in no distress ? Psych: mood appropriate, normal affect ? Skin: warm & dry  ? Cardiovascular: normal heart rate  noted ? Respiratory: normal respiratory effort, no distress ? Abdomen: soft, non-tender  ? Pelvic: VULVA: normal appearing vulva with no masses, tenderness or lesions, VAGINA: ~ 10cc of dark blood noted in vaginal vault, CERVIX: appears clots, no products visible at os, minimal dark blood noted at cervix UTERUS: enlarged, non-tender ? Extremities: no edema  ? ?Chaperone: JLevy Pupa  ? ?Bedside [place] IUP not seen and pt just voided.  Exam very limited ? ?Assessment & Plan:  ?1) Threatened miscarriage ?-next step TVUS for further evaluation, scheduled for next week ?-lab work today including CBC, Rh type and hCG for baseline ?-reviewed precautions with patient- should she note heavy bleeding, dizziness, near syncope or other acute concerns advised pt to go WC&C (MAU) ?-advised pelvic rest for now ?-Questions and concerns were addressed ? ?Orders Placed This Encounter  ?Procedures  ? CBC  ? ABO AND RH   ? Beta hCG quant (ref lab)  ? ? ?Return for follow up as scheduled. ? ? ?JJanyth Pupa DO ?[facility]ending [facility] ?[facility]? ? ? ?

## 2021-05-05 ENCOUNTER — Other Ambulatory Visit: Payer: Self-pay | Admitting: Obstetrics & Gynecology

## 2021-05-05 DIAGNOSIS — O200 Threatened abortion: Secondary | ICD-10-CM

## 2021-05-05 LAB — ABO AND RH: Rh Factor: POSITIVE

## 2021-05-05 LAB — CBC
Hematocrit: 42.1 % (ref 34.0–46.6)
Hemoglobin: 14.4 g/dL (ref 11.1–15.9)
MCH: 28.1 pg (ref 26.6–33.0)
MCHC: 34.2 g/dL (ref 31.5–35.7)
MCV: 82 fL (ref 79–97)
Platelets: 216 10*3/uL (ref 150–450)
RBC: 5.12 x10E6/uL (ref 3.77–5.28)
RDW: 12.6 % (ref 11.7–15.4)
WBC: 8.1 10*3/uL (ref 3.4–10.8)

## 2021-05-05 LAB — BETA HCG QUANT (REF LAB): hCG Quant: 1720 m[IU]/mL

## 2021-05-05 NOTE — Progress Notes (Signed)
Repeat HCG for 48hr check ?

## 2021-05-06 ENCOUNTER — Other Ambulatory Visit: Payer: No Typology Code available for payment source

## 2021-05-06 ENCOUNTER — Encounter (HOSPITAL_COMMUNITY): Payer: Self-pay | Admitting: Obstetrics and Gynecology

## 2021-05-06 ENCOUNTER — Inpatient Hospital Stay (HOSPITAL_COMMUNITY): Payer: No Typology Code available for payment source

## 2021-05-06 ENCOUNTER — Inpatient Hospital Stay (HOSPITAL_COMMUNITY)
Admission: AD | Admit: 2021-05-06 | Discharge: 2021-05-06 | Disposition: A | Discharge status: Home / Self Care | Payer: No Typology Code available for payment source | Attending: Obstetrics and Gynecology | Admitting: Obstetrics and Gynecology

## 2021-05-06 ENCOUNTER — Other Ambulatory Visit: Payer: Self-pay

## 2021-05-06 ENCOUNTER — Encounter: Payer: Self-pay | Admitting: *Deleted

## 2021-05-06 DIAGNOSIS — O039 Complete or unspecified spontaneous abortion without complication: Secondary | ICD-10-CM | POA: Diagnosis present

## 2021-05-06 DIAGNOSIS — O200 Threatened abortion: Secondary | ICD-10-CM

## 2021-05-06 LAB — CBC
HCT: 43.6 % (ref 36.0–46.0)
Hemoglobin: 15.4 g/dL — ABNORMAL HIGH (ref 12.0–15.0)
MCH: 28.3 pg (ref 26.0–34.0)
MCHC: 35.3 g/dL (ref 30.0–36.0)
MCV: 80.1 fL (ref 80.0–100.0)
Platelets: 231 10*3/uL (ref 150–400)
RBC: 5.44 MIL/uL — ABNORMAL HIGH (ref 3.87–5.11)
RDW: 12.3 % (ref 11.5–15.5)
WBC: 9.3 10*3/uL (ref 4.0–10.5)
nRBC: 0 % (ref 0.0–0.2)

## 2021-05-06 LAB — HCG, QUANTITATIVE, PREGNANCY: hCG, Beta Chain, Quant, S: 271 m[IU]/mL — ABNORMAL HIGH (ref <5)

## 2021-05-06 LAB — ABO/RH: ABO/RH(D): O POS

## 2021-05-06 NOTE — MAU Provider Note (Signed)
Chief Complaint:  Vaginal Bleeding and Abdominal Pain ? ? Event Date/Time  ? First Provider Initiated Contact with Patient 05/06/21 1829   ?  ?HPI: Meredith Hall is a 31 y.o. G1P0 at 62w6dwho presents to maternity admissions reporting increased vaginal bleeding and cramping. Has been at [facility] following her HCG, last value drawn this morning but it has not resulted. Very concerned for miscarriage. No other physical complaints. (Workup completed from lobby due to MAU acuity.) ? ?Pregnancy Course: [facility]. Last HCG=1720 on 05/04/21. ? ?Past Medical History:  ?Diagnosis Date  ? Eczema   ? ?OB History  ?Gravida Para Term Preterm AB Living  ?1            ?SAB IAB Ectopic Multiple Live Births  ?           ?  ?# Outcome Date GA Lbr Len/2nd Weight Sex Delivery Anes PTL Lv  ?1 Current           ? ?Past Surgical History:  ?Procedure Laterality Date  ? WISDOM TOOTH EXTRACTION    ? ?Family History  ?Problem Relation Age of Onset  ? Diabetes Father   ? Thyroid disease Mother   ? ?Social History  ? ?Tobacco Use  ? Smoking status: Never  ? Smokeless tobacco: Never  ?Vaping Use  ? Vaping Use: Never used  ?Substance Use Topics  ? Alcohol use: No  ? Drug use: No  ? ?Allergies  ?Allergen Reactions  ? Shrimp [Shellfish Allergy]   ?  Red, itching rash  ? ?No medications prior to admission.  ? ?I have reviewed patient's Past Medical Hx, Surgical Hx, Family Hx, Social Hx, medications and allergies.  ? ?ROS:  ?Pertinent items noted in HPI and remainder of comprehensive ROS otherwise negative. ? ?Physical Exam  ?Patient Vitals for the past 24 hrs: ? BP Temp Temp src Pulse Resp SpO2 Height Weight  ?05/06/21 1458 124/89 99.2 ?F (37.3 ?C) Oral (!) 106 17 100 % '5\' 5"'$  (1.651 m) 142 lb 6.4 oz (64.6 kg)  ? ?Physical Exam ?Vitals and nursing note reviewed.  ?Constitutional:   ?   Appearance: She is well-developed and normal weight. She is not ill-appearing.  ?HENT:  ?   Head: Normocephalic  and atraumatic.  ?Eyes:  ?   Pupils: Pupils are equal, round, and reactive to light.  ?Cardiovascular:  ?   Rate and Rhythm: Normal rate and regular rhythm.  ?Pulmonary:  ?   Effort: Pulmonary effort is normal.  ?Genitourinary: ?   Comments: Pelvic exam deferred, sent to U/S ?Musculoskeletal:     ?   General: Normal range of motion.  ?Skin: ?   General: Skin is warm and dry.  ?   Capillary Refill: Capillary refill takes less than 2 seconds.  ?Neurological:  ?   Mental Status: She is alert and oriented to person, place, and time.  ?Psychiatric:     ?   Mood and Affect: Mood normal.     ?   Behavior: Behavior normal.     ?   Thought Content: Thought content normal.     ?   Judgment: Judgment normal.  ?  ?Labs: ?Results for orders placed or performed during the hospital encounter of 05/06/21 (from the past 24 hour(s))  ?CBC     Status: Abnormal  ? Collection Time: 05/06/21  4:28 PM  ?Result Value Ref Range  ? WBC 9.3 4.0 - 10.5 K/uL  ?  RBC 5.44 (H) 3.87 - 5.11 MIL/uL  ? Hemoglobin 15.4 (H) 12.0 - 15.0 g/dL  ? HCT 43.6 36.0 - 46.0 %  ? MCV 80.1 80.0 - 100.0 fL  ? MCH 28.3 26.0 - 34.0 pg  ? MCHC 35.3 30.0 - 36.0 g/dL  ? RDW 12.3 11.5 - 15.5 %  ? Platelets 231 150 - 400 K/uL  ? nRBC 0.0 0.0 - 0.2 %  ?ABO/Rh     Status: None  ? Collection Time: 05/06/21  4:28 PM  ?Result Value Ref Range  ? ABO/RH(D) O POS   ? No rh immune globuloin    ?  NOT A RH IMMUNE GLOBULIN CANDIDATE, PT RH POSITIVE ?Performed at [facility], [place] [address], [place], [place] [address] ?  ?hCG, quantitative, pregnancy     Status: Abnormal  ? Collection Time: 05/06/21  4:28 PM  ?Result Value Ref Range  ? hCG, Beta Chain, Quant, S 271 (H) <5 mIU/mL  ? ?Imaging:  ?US OB LESS THAN 14 WEEKS WITH OB TRANSVAGINAL ? ?Result Date: 05/06/2021 ?CLINICAL DATA:  Pelvic pain and vaginal bleeding x4 days. EXAM: OBSTETRIC <14 WK [place] AND TRANSVAGINAL OB US TECHNIQUE: Both transabdominal and transvaginal ultrasound examinations were performed for complete  evaluation of the gestation as well as the maternal uterus, adnexal regions, and pelvic cul-de-sac. Transvaginal technique was performed to assess early pregnancy. COMPARISON:  None. FINDINGS: Intrauterine gestational sac: None Yolk sac:  Not Visualized. Embryo:  Not Visualized. Cardiac Activity: Not Visualized. Heart Rate: N/A  bpm Subchorionic hemorrhage:  None visualized. Maternal uterus/adnexae: A 9.5 cm x 7.5 cm x 7.3 cm heterogeneous uterine fibroid is seen on the right, with subsequent mass effect on the endometrium which is displaced to the left. The right ovary is not visualized. The left ovary is visualized and is normal in appearance. No pelvic free fluid is identified. IMPRESSION: 1. No evidence of an intrauterine pregnancy. 2. Large heterogeneous uterine fibroid, as described above. Electronically Signed   By: Virgina Norfolk M.D.   On: 05/06/2021 18:25   ? ?MAU Course: ?Orders Placed This Encounter  ?Procedures  ? US OB LESS THAN 14 WEEKS WITH OB TRANSVAGINAL  ? CBC  ? hCG, quantitative, pregnancy  ? ABO/Rh  ? Discharge patient  ? ?MDM: ?HCG decreased to 271, confirming miscarriage especially in light of her U/S results. ?Pt and FOB brought to family room for results. Gave condolences and results of U/S and labwork confirming miscarriage. Educated on expected progression of bleeding and suggested follow up at [facility]. Pt appropriately sad but agreed to plan of care.  ? ?Assessment: ?1. Miscarriage   ? ?Plan: ?Discharge home in stable condition with bleeding precautions  ?  ? Follow-up Information   ? ? [facility]. Go in 1 week(s).   ?Specialty: Obstetrics and Gynecology ?Why: for repeat bloodwork, then two weeks with Dr. Nelda Marseille. ?Contact information: ?[address] [place] ?[phone] ? ?  ?  ? ?  ?  ? ?  ?  ?Allergies as of 05/06/2021   ? ?   Reactions  ? Shrimp [shellfish Allergy]   ? Red, itching rash  ? ?  ? ?  ?Medication List  ?  ?You have not  been prescribed any medications. ?  ? ?Gaylan Gerold, CNM, MSN, IBCLC ?Certified Nurse Midwife, [place] ? ? ? ? ?

## 2021-05-06 NOTE — MAU Note (Signed)
Meredith Hall is a 31 y.o. at Unknown here in MAU reporting: was having lower back pain, radiates to the front- been ongoing- started on Wed.  Also bleeding, started spotting on Tue, had a gush of blood at work on [employer].   Went to [facility] on Wed, did labs.had labs repeated today, when the pain came back, she was told to come here. Changing pad every few hrs, it is not full ?LMP: Mar 3 ?Onset of complaint: Tues ?Pain score: no pain now, has mild discomfort ?Vitals:  ? 05/06/21 1458  ?BP: 124/89  ?Pulse: (!) 106  ?Resp: 17  ?Temp: 99.2 ?F (37.3 ?C)  ?SpO2: 100%  ?   ? ?Lab orders placed from triage:  none ?

## 2021-05-07 LAB — BETA HCG QUANT (REF LAB): hCG Quant: 274 m[IU]/mL

## 2021-05-09 ENCOUNTER — Telehealth: Payer: Self-pay | Admitting: Obstetrics & Gynecology

## 2021-05-09 NOTE — Telephone Encounter (Signed)
Called to review lab report.  Pt was seen in ER on Friday.  [place] on 4/27 cancelled, but will still follow up with Knute Neu as scheduled this week ?

## 2021-05-09 NOTE — Telephone Encounter (Signed)
Patient calling states that she is going thru [place] and is having some clotting and states that she was told to call the office and she also needs a note for work.  ?

## 2021-05-09 NOTE — Telephone Encounter (Signed)
Returned pt's call. Two identifiers used. Pt stated that she had some slightly larger than quarter sized clots fall into the toilet when she used the bathroom. She felt overall better today and her bleeding had slowed with no clots. Pt denies any severe bleeding or pain. Pt instructed to monitor and keep her appt on 4/27. Pt confirmed understanding. ?

## 2021-05-12 ENCOUNTER — Other Ambulatory Visit: Payer: No Typology Code available for payment source

## 2021-05-12 ENCOUNTER — Other Ambulatory Visit: Payer: Self-pay

## 2021-05-12 ENCOUNTER — Encounter: Payer: Self-pay | Admitting: Women's Health

## 2021-05-12 ENCOUNTER — Ambulatory Visit: Payer: No Typology Code available for payment source | Admitting: Women's Health

## 2021-05-12 VITALS — BP 136/77 | HR 76 | Ht 65.0 in | Wt 143.4 lb

## 2021-05-12 DIAGNOSIS — D259 Leiomyoma of uterus, unspecified: Secondary | ICD-10-CM | POA: Diagnosis not present

## 2021-05-12 DIAGNOSIS — O039 Complete or unspecified spontaneous abortion without complication: Secondary | ICD-10-CM | POA: Diagnosis not present

## 2021-05-12 NOTE — Progress Notes (Signed)
? ?GYN VISIT ?Patient name: Meredith Hall MRN [id number]  Date of birth: [birth year] ?Chief Complaint:   ?Follow-up and Miscarriage ? ?History of Present Illness:   ?Meredith Hall is a 31 y.o. G47P0010 Hispanic female being seen today for f/u after SAB.   BHCG 1720 on 4/19 when she was seen for bleeding/clots, down to 271 on 4/21 in MAU. U/S 4/21 in MAU, no evidence of IUP, Rt ovary not visualized, Lt ovary normal, 9.5cm uterine fibroid on Rt w/ subsequent mass effect on endometrium which is displaced to Lt. Bleeding has slowed, only small amt brown blood w/ wiping, cramping has improved, just some cramping LLQ. Does want to try again soon for a pregnancy. Stopped pnv. Started trying in Aug 2022 for this pregnancy. Periods prior were regular x 5d, changed pads q 3-4hr, some days w/ lots of clots- largest golf-ball sized, some cramping. No h/o fibroids she's aware of.  ?Patient's last menstrual period was 03/19/2021. ?The current method of family planning is none.  ?Last pap ~67yr ago. Results were: negative per pt report at her PCP ? ? ?  02/16/2021  ? 11:34 AM 07/07/2020  ?  9:04 AM 06/04/2020  ? 12:25 PM 06/04/2020  ? 12:15 PM 05/07/2020  ? 11:41 AM  ?Depression screen PHQ 2/9  ?Decreased Interest 0 0 0 0 0  ?Down, Depressed, Hopeless 0 0 0 1 0  ?PHQ - 2 Score 0 0 0 1 0  ?Altered sleeping  0 0    ?Tired, decreased energy  0 0    ?Change in appetite  0 0    ?Feeling bad or failure about yourself   0 0    ?Trouble concentrating  0 0    ?Moving slowly or fidgety/restless  0 0    ?Suicidal thoughts  0 0    ?PHQ-9 Score  0 0    ?Difficult doing work/chores  Not difficult at all Not difficult at all    ? ?  ? ?  07/07/2020  ?  9:04 AM  ?GAD 7 : Generalized Anxiety Score  ?Nervous, Anxious, on Edge 0  ?Control/stop worrying 0  ?Worry too much - different things 0  ?Trouble relaxing 0  ?Restless 0  ?Easily annoyed or irritable 0  ?Afraid - awful might happen 0  ?Total GAD 7 Score 0  ?Anxiety Difficulty Not difficult at  all  ? ? ? ?Review of Systems:   ?Pertinent items are noted in HPI ?Denies fever/chills, dizziness, headaches, visual disturbances, fatigue, shortness of breath, chest pain, abdominal pain, vomiting, abnormal vaginal discharge/itching/odor/irritation, problems with periods, bowel movements, urination, or intercourse unless otherwise stated above.  ?Pertinent History Reviewed:  ?Reviewed past medical,surgical, social, obstetrical and family history.  ?Reviewed problem list, medications and allergies. ?Physical Assessment:  ? ?Vitals:  ? 05/12/21 1617  ?BP: 136/77  ?Pulse: 76  ?Weight: 143 lb 6.4 oz (65 kg)  ?Height: '5\' 5"'$  (1.651 m)  ?Body mass index is 23.86 kg/m?. ? ?     Physical Examination:  ? General appearance: alert, well appearing, and in no distress ? Mental status: alert, oriented to person, place, and time ? Skin: warm & dry  ? Cardiovascular: normal heart rate noted ? Respiratory: normal respiratory effort, no distress ? Abdomen: soft, non-tender  ? Pelvic: examination not indicated ? Extremities: no edema  ? ?Chaperone: N/A   ? ?No results found for this or any previous visit (from the past 24 hour(s)).  ?Assessment & Plan:  ?1)  SAB> Rh+, will check hcg today as Rt ovary was not visualized on u/s (likely b/c of large uterine fibroid on that side). If severe pain/heavy bleeding, go back to MAU. No sex until bleeding has completely stopped. Condoms/pullout for now ? ?2) 9.5x7.5cm Rt uterine fibroid> displacing endometrium seen on u/s 4/21 in MAU, discussed w/ [facility], get u/s here in 6wks, f/u w/ MD to discuss if surgical removal recommended prior to trying to conceive again.  ? ?Meds: No orders of the defined types were placed in this encounter. ? ? ?Orders Placed This Encounter  ?Procedures  ? Beta hCG quant (ref lab)  ? ? ?Return for 6wks gyn u/s and f/u w/ MD only after to discuss and for pap. ? ?Roma Schanz CNM, WHNP-BC ?05/12/2021 ?4:42 PM  ?

## 2021-05-13 ENCOUNTER — Other Ambulatory Visit: Payer: No Typology Code available for payment source

## 2021-05-13 LAB — BETA HCG QUANT (REF LAB): hCG Quant: 34 m[IU]/mL

## 2021-06-22 ENCOUNTER — Other Ambulatory Visit: Payer: Self-pay | Admitting: Women's Health

## 2021-06-22 DIAGNOSIS — D219 Benign neoplasm of connective and other soft tissue, unspecified: Secondary | ICD-10-CM

## 2021-06-24 ENCOUNTER — Ambulatory Visit: Payer: No Typology Code available for payment source | Admitting: Obstetrics & Gynecology

## 2021-06-24 ENCOUNTER — Other Ambulatory Visit (HOSPITAL_COMMUNITY)
Admission: RE | Admit: 2021-06-24 | Discharge: 2021-06-24 | Disposition: A | Discharge status: Home / Self Care | Payer: No Typology Code available for payment source | Source: Ambulatory Visit | Attending: Obstetrics & Gynecology | Admitting: Obstetrics & Gynecology

## 2021-06-24 ENCOUNTER — Ambulatory Visit (INDEPENDENT_AMBULATORY_CARE_PROVIDER_SITE_OTHER): Payer: No Typology Code available for payment source

## 2021-06-24 ENCOUNTER — Encounter: Payer: Self-pay | Admitting: Obstetrics & Gynecology

## 2021-06-24 VITALS — BP 139/92 | HR 94 | Ht 64.0 in | Wt 133.8 lb

## 2021-06-24 DIAGNOSIS — O039 Complete or unspecified spontaneous abortion without complication: Secondary | ICD-10-CM

## 2021-06-24 DIAGNOSIS — D252 Subserosal leiomyoma of uterus: Secondary | ICD-10-CM | POA: Diagnosis not present

## 2021-06-24 DIAGNOSIS — D219 Benign neoplasm of connective and other soft tissue, unspecified: Secondary | ICD-10-CM

## 2021-06-24 DIAGNOSIS — Z124 Encounter for screening for malignant neoplasm of cervix: Secondary | ICD-10-CM | POA: Insufficient documentation

## 2021-06-24 DIAGNOSIS — Z3009 Encounter for other general counseling and advice on contraception: Secondary | ICD-10-CM

## 2021-06-24 NOTE — Progress Notes (Signed)
PELVIC US TA/TV: enlarged heterogeneous uterus,large subserosal fibroid anterior right 8.6 x 6.3 x 8.1 cm,EEC 11.6 mm,normal ovaries,no pain during ultrasound,no free fluid  Chaperone: Clinical cytogeneticist

## 2021-06-24 NOTE — Progress Notes (Unsigned)
GYN VISIT Patient name: Meredith Hall MRN [id number]  Date of birth: 10-16-1990 Chief Complaint:   discuss [place] results (Needs pap)  History of Present Illness:   Meredith Hall is a 31 y.o. G99P0010 female being seen today for follow up regarding:  -SAB- seen in MAU- hCG had dropped to 34.  [place] had confirmed SAB; however, large anterior fibroid was also seen and pt was advised to follow up in office.  She reports she currently is not having any bleeding or cramping.  Reports no acute complaints.  Prior to recent miscarriage- Menses last for 3-4 days.  Day 2-3 passage of large clots, but not every time.  Overall periods regular each month without issue.  [place] today- 6/9: PELVIC US TA/TV: enlarged heterogeneous uterus,large subserosal fibroid anterior right 8.6 x 6.3 x 8.1 cm,EEC 11.6 mm,normal ovaries,no pain during ultrasound,no free fluid  Patient's last menstrual period was 06/04/2021.     02/16/2021   11:34 AM 07/07/2020    9:04 AM 06/04/2020   12:25 PM 06/04/2020   12:15 PM 05/07/2020   11:41 AM  Depression screen PHQ 2/9  Decreased Interest 0 0 0 0 0  Down, Depressed, Hopeless 0 0 0 1 0  PHQ - 2 Score 0 0 0 1 0  Altered sleeping  0 0    Tired, decreased energy  0 0    Change in appetite  0 0    Feeling bad or failure about yourself   0 0    Trouble concentrating  0 0    Moving slowly or fidgety/restless  0 0    Suicidal thoughts  0 0    PHQ-9 Score  0 0    Difficult doing work/chores  Not difficult at all Not difficult at all       Review of Systems:   Pertinent items are noted in HPI Denies fever/chills, dizziness, headaches, visual disturbances, fatigue, shortness of breath, chest pain, abdominal pain, vomiting, Denies problems with periods, bowel movements, urination, or intercourse unless otherwise stated above.  Pertinent History Reviewed:  Reviewed past medical,surgical, social, obstetrical and family history.  Reviewed problem list, medications and  allergies. Physical Assessment:   Vitals:   06/24/21 1051  BP: (!) 139/92  Pulse: 94  Weight: 133 lb 12.8 oz (60.7 kg)  Height: '5\' 4"'$  (1.626 m)  Body mass index is 22.97 kg/m.       Physical Examination:   General appearance: alert, well appearing, and in no distress  Psych: mood appropriate, normal affect  Skin: warm & dry   Cardiovascular: normal heart rate noted  Respiratory: normal respiratory effort, no distress  Abdomen: soft, non-tender, no rebound, no guarding, enlarged uterus ~ 3cm above pubic bone  Pelvic: VULVA: normal appearing vulva with no masses, tenderness or lesions, VAGINA: normal appearing vagina with normal color and discharge, no lesions, CERVIX: normal appearing cervix without discharge or lesions, Pap collected UTERUS: enlarged mobile uterus  Extremities: no edema, no calf tenderness bilaterally  Chaperone: Meredith Hall    Assessment & Plan:  1) Recent miscarriage -based on today's [place] and prior HCG suspect now complete miscarriage  2) Preventive screening -Pap completed, reviewed screening guidelines  3) Uterine fibroid -discussed findings on ultrasound and today's exam -while fibroid is present, it is not distorting/within uterine cavity and do no think it was related to her miscarriage -discussed that removal could be an option if she had issues with HMB or significant dysmenorrhea -Since she has proven she  can conceive and she desires a pregnancy, would recommend she await a regular period and then return to trying to conceive -review fecundity and reassured pt that it can take up to 41yrand be considered "normal" -Questions and concerns were addressed -pt is concerned about the fibroid and impact on fertility and pregnancy- reviewed her concerns and will plan to f/u in 6 mos to re-evaluate/check in  Total time spent both face to face and reviewing patient chart: 333m.  Return in about 6 weeks (around 08/05/2021) for with Dr. OzNelda Marseiller Dr.  EuElonda Husky  JeJanyth PupaDO Attending [facility]

## 2021-06-27 LAB — CYTOLOGY - PAP
Chlamydia: NEGATIVE
Comment: NEGATIVE
Comment: NEGATIVE
Comment: NORMAL
Diagnosis: NEGATIVE
High risk HPV: NEGATIVE
Neisseria Gonorrhea: NEGATIVE

## 2021-07-18 ENCOUNTER — Ambulatory Visit (INDEPENDENT_AMBULATORY_CARE_PROVIDER_SITE_OTHER): Payer: No Typology Code available for payment source | Admitting: Family

## 2021-07-18 ENCOUNTER — Encounter: Payer: Self-pay | Admitting: Family

## 2021-07-18 VITALS — BP 132/87 | HR 135 | Temp 97.8°F | Ht 64.0 in | Wt 135.0 lb

## 2021-07-18 DIAGNOSIS — D259 Leiomyoma of uterus, unspecified: Secondary | ICD-10-CM

## 2021-07-18 DIAGNOSIS — Z0001 Encounter for general adult medical examination with abnormal findings: Secondary | ICD-10-CM | POA: Diagnosis not present

## 2021-07-18 DIAGNOSIS — E559 Vitamin D deficiency, unspecified: Secondary | ICD-10-CM

## 2021-07-18 DIAGNOSIS — Z0000 Encounter for general adult medical examination without abnormal findings: Secondary | ICD-10-CM

## 2021-07-18 NOTE — Progress Notes (Signed)
   Subjective:    Patient ID: Meredith Hall, female    DOB: Dec 22, 1990, 31 y.o.   MRN: [id number]  Chief Complaint  Patient presents with   Annual Exam    HPI Pt presents to the office today for CPE without pap. She is followed by GYN.  She is currently taking zyrtec 10 mg daily.   She has a fibroid and thinking about getting this removed.   Pt denies any headache, palpitations, SOB, or edema at this time.   She has depression and anxiety, but states these are stable. She is not currently taking any medications for these.   Review of Systems  All other systems reviewed and are negative.      Objective:   Physical Exam Vitals reviewed.  Constitutional:      General: She is not in acute distress.    Appearance: She is well-developed. She is obese.  HENT:     Head: Normocephalic and atraumatic.     Right Ear: Tympanic membrane normal.     Left Ear: Tympanic membrane normal.  Eyes:     Pupils: Pupils are equal, round, and reactive to light.  Neck:     Thyroid: No thyromegaly.  Cardiovascular:     Rate and Rhythm: Normal rate and regular rhythm.     Heart sounds: Normal heart sounds. No murmur heard. Pulmonary:     Effort: Pulmonary effort is normal. No respiratory distress.     Breath sounds: Normal breath sounds. No wheezing.  Abdominal:     General: Bowel sounds are normal. There is no distension.     Palpations: Abdomen is soft.     Tenderness: There is no abdominal tenderness.  Musculoskeletal:        General: No tenderness. Normal range of motion.     Cervical back: Normal range of motion and neck supple.  Skin:    General: Skin is warm and dry.  Neurological:     Mental Status: She is alert and oriented to person, place, and time.     Cranial Nerves: No cranial nerve deficit.     Deep Tendon Reflexes: Reflexes are normal and symmetric.  Psychiatric:        Behavior: Behavior normal.        Thought Content: Thought content normal.        Judgment:  Judgment normal.       BP 132/87   Pulse (!) 135   Temp 97.8 F (36.6 C) (Temporal)   Ht $R'5\' 4"'Ni$  (1.626 m)   Wt 135 lb (61.2 kg)   LMP 06/04/2021   SpO2 97%   BMI 23.17 kg/m      Assessment & Plan:  Meredith Hall comes in today with chief complaint of Annual Exam   Diagnosis and orders addressed:  1. Vitamin D deficiency - Vitamin D, 25-hydroxy  2. Annual physical exam - Lipid panel - CBC with Differential/Platelet - CMP14+EGFR - TSH - T4, Free  3. Uterine leiomyoma, unspecified location - US Pelvic Complete With Transvaginal; Future   Labs pending Health Maintenance reviewed Diet and exercise encouraged  Follow up plan: 1 year    Evelina Dun, FNP

## 2021-07-18 NOTE — Patient Instructions (Signed)

## 2021-07-19 LAB — CBC WITH DIFFERENTIAL/PLATELET
Basophils Absolute: 0.1 10*3/uL (ref 0.0–0.2)
Basos: 1 %
EOS (ABSOLUTE): 0.2 10*3/uL (ref 0.0–0.4)
Eos: 4 %
Hematocrit: 42.3 % (ref 34.0–46.6)
Hemoglobin: 14.3 g/dL (ref 11.1–15.9)
Immature Grans (Abs): 0 10*3/uL (ref 0.0–0.1)
Immature Granulocytes: 0 %
Lymphocytes Absolute: 1.8 10*3/uL (ref 0.7–3.1)
Lymphs: 30 %
MCH: 28.8 pg (ref 26.6–33.0)
MCHC: 33.8 g/dL (ref 31.5–35.7)
MCV: 85 fL (ref 79–97)
Monocytes Absolute: 0.4 10*3/uL (ref 0.1–0.9)
Monocytes: 6 %
Neutrophils Absolute: 3.5 10*3/uL (ref 1.4–7.0)
Neutrophils: 59 %
Platelets: 234 10*3/uL (ref 150–450)
RBC: 4.97 x10E6/uL (ref 3.77–5.28)
RDW: 12.8 % (ref 11.7–15.4)
WBC: 6 10*3/uL (ref 3.4–10.8)

## 2021-07-19 LAB — CMP14+EGFR
ALT: 9 IU/L (ref 0–32)
AST: 14 IU/L (ref 0–40)
Albumin/Globulin Ratio: 1.8 (ref 1.2–2.2)
Albumin: 4.4 g/dL (ref 3.8–4.8)
Alkaline Phosphatase: 75 IU/L (ref 44–121)
BUN/Creatinine Ratio: 17 (ref 9–23)
BUN: 10 mg/dL (ref 6–20)
Bilirubin Total: 0.4 mg/dL (ref 0.0–1.2)
CO2: 21 mmol/L (ref 20–29)
Calcium: 9.4 mg/dL (ref 8.7–10.2)
Chloride: 101 mmol/L (ref 96–106)
Creatinine, Ser: 0.58 mg/dL (ref 0.57–1.00)
Globulin, Total: 2.5 g/dL (ref 1.5–4.5)
Glucose: 97 mg/dL (ref 70–99)
Potassium: 4.2 mmol/L (ref 3.5–5.2)
Sodium: 136 mmol/L (ref 134–144)
Total Protein: 6.9 g/dL (ref 6.0–8.5)
eGFR: 124 mL/min/{1.73_m2} (ref >59)

## 2021-07-19 LAB — VITAMIN D 25 HYDROXY (VIT D DEFICIENCY, FRACTURES): Vit D, 25-Hydroxy: 17.8 ng/mL — ABNORMAL LOW (ref 30.0–100.0)

## 2021-07-19 LAB — LIPID PANEL
Chol/HDL Ratio: 3.7 ratio (ref 0.0–4.4)
Cholesterol, Total: 133 mg/dL (ref 100–199)
HDL: 36 mg/dL — ABNORMAL LOW (ref >39)
LDL Chol Calc (NIH): 85 mg/dL (ref 0–99)
Triglycerides: 54 mg/dL (ref 0–149)
VLDL Cholesterol Cal: 12 mg/dL (ref 5–40)

## 2021-07-19 LAB — TSH: TSH: 0.451 u[IU]/mL (ref 0.450–4.500)

## 2021-07-19 LAB — T4, FREE: Free T4: 1.14 ng/dL (ref 0.82–1.77)

## 2021-07-22 ENCOUNTER — Other Ambulatory Visit (HOSPITAL_COMMUNITY): Payer: Self-pay

## 2021-07-22 ENCOUNTER — Other Ambulatory Visit: Payer: Self-pay | Admitting: Family

## 2021-07-22 MED ORDER — VITAMIN D (ERGOCALCIFEROL) 1.25 MG (50000 UNIT) PO CAPS
50000.0000 [IU] | ORAL_CAPSULE | ORAL | 3 refills | Status: DC
  Filled 2021-07-22: qty 12, 84d supply, fill #0
  Filled 2022-03-11: qty 12, 84d supply, fill #1

## 2021-07-29 ENCOUNTER — Other Ambulatory Visit: Payer: No Typology Code available for payment source

## 2021-08-01 ENCOUNTER — Ambulatory Visit (INDEPENDENT_AMBULATORY_CARE_PROVIDER_SITE_OTHER): Payer: No Typology Code available for payment source | Admitting: Family

## 2021-08-01 ENCOUNTER — Encounter: Payer: Self-pay | Admitting: Family

## 2021-08-01 DIAGNOSIS — R399 Unspecified symptoms and signs involving the genitourinary system: Secondary | ICD-10-CM | POA: Diagnosis not present

## 2021-08-01 LAB — WET PREP FOR TRICH, YEAST, CLUE
Clue Cell Exam: NEGATIVE
Trichomonas Exam: NEGATIVE
Yeast Exam: POSITIVE — AB

## 2021-08-01 MED ORDER — FLUCONAZOLE 150 MG PO TABS: 150.0000 mg | ORAL_TABLET | ORAL | 0 refills | Status: DC

## 2021-08-01 NOTE — Patient Instructions (Signed)
Urinary Tract Infection, Adult  A urinary tract infection (UTI) is an infection of any part of the urinary tract. The urinary tract includes the kidneys, ureters, bladder, and urethra. These organs make, store, and get rid of urine in the body. An upper UTI affects the ureters and kidneys. A lower UTI affects the bladder and urethra. What are the causes? Most urinary tract infections are caused by bacteria in your genital area around your urethra, where urine leaves your body. These bacteria grow and cause inflammation of your urinary tract. What increases the risk? You are more likely to develop this condition if: You have a urinary catheter that stays in place. You are not able to control when you urinate or have a bowel movement (incontinence). You are female and you: Use a spermicide or diaphragm for birth control. Have low estrogen levels. Are pregnant. You have certain genes that increase your risk. You are sexually active. You take antibiotic medicines. You have a condition that causes your flow of urine to slow down, such as: An enlarged prostate, if you are female. Blockage in your urethra. A kidney stone. A nerve condition that affects your bladder control (neurogenic bladder). Not getting enough to drink, or not urinating often. You have certain medical conditions, such as: Diabetes. A weak disease-fighting system (immunesystem). Sickle cell disease. Gout. Spinal cord injury. What are the signs or symptoms? Symptoms of this condition include: Needing to urinate right away (urgency). Frequent urination. This may include small amounts of urine each time you urinate. Pain or burning with urination. Blood in the urine. Urine that smells bad or unusual. Trouble urinating. Cloudy urine. Vaginal discharge, if you are female. Pain in the abdomen or the lower back. You may also have: Vomiting or a decreased appetite. Confusion. Irritability or tiredness. A fever or  chills. Diarrhea. The first symptom in older adults may be confusion. In some cases, they may not have any symptoms until the infection has worsened. How is this diagnosed? This condition is diagnosed based on your medical history and a physical exam. You may also have other tests, including: Urine tests. Blood tests. Tests for STIs (sexually transmitted infections). If you have had more than one UTI, a cystoscopy or imaging studies may be done to determine the cause of the infections. How is this treated? Treatment for this condition includes: Antibiotic medicine. Over-the-counter medicines to treat discomfort. Drinking enough water to stay hydrated. If you have frequent infections or have other conditions such as a kidney stone, you may need to see a health care provider who specializes in the urinary tract (urologist). In rare cases, urinary tract infections can cause sepsis. Sepsis is a life-threatening condition that occurs when the body responds to an infection. Sepsis is treated in the hospital with IV antibiotics, fluids, and other medicines. Follow these instructions at home:  Medicines Take over-the-counter and prescription medicines only as told by your health care provider. If you were prescribed an antibiotic medicine, take it as told by your health care provider. Do not stop using the antibiotic even if you start to feel better. General instructions Make sure you: Empty your bladder often and completely. Do not hold urine for long periods of time. Empty your bladder after sex. Wipe from front to back after urinating or having a bowel movement if you are female. Use each tissue only one time when you wipe. Drink enough fluid to keep your urine pale yellow. Keep all follow-up visits. This is important. Contact a health   care provider if: Your symptoms do not get better after 1-2 days. Your symptoms go away and then return. Get help right away if: You have severe pain in  your back or your lower abdomen. You have a fever or chills. You have nausea or vomiting. Summary A urinary tract infection (UTI) is an infection of any part of the urinary tract, which includes the kidneys, ureters, bladder, and urethra. Most urinary tract infections are caused by bacteria in your genital area. Treatment for this condition often includes antibiotic medicines. If you were prescribed an antibiotic medicine, take it as told by your health care provider. Do not stop using the antibiotic even if you start to feel better. Keep all follow-up visits. This is important. This information is not intended to replace advice given to you by your health care provider. Make sure you discuss any questions you have with your health care provider. Document Revised: 08/15/2019 Document Reviewed: 08/15/2019 Elsevier Patient Education  2023 [employer]  

## 2021-08-01 NOTE — Progress Notes (Signed)
   Virtual Visit  Note Due to COVID-19 pandemic this visit was conducted virtually. This visit type was conducted due to national recommendations for restrictions regarding the COVID-19 Pandemic (e.g. social distancing, sheltering in place) in an effort to limit this patient's exposure and mitigate transmission in our community. All issues noted in this document were discussed and addressed.  A physical exam was not performed with this format.  I connected with Meredith Hall on 08/01/21 at work by telephone and verified that I am speaking with the correct person using two identifiers. Meredith Hall is currently located at work and no one is currently with her during visit. The provider, Evelina Dun, FNP is located in their office at time of visit.  I discussed the limitations, risks, security and privacy concerns of performing an evaluation and management service by telephone and the availability of in person appointments. I also discussed with the patient that there may be a patient responsible charge related to this service. The patient expressed understanding and agreed to proceed.   History and Present Illness:  Dysuria  This is a new problem. The current episode started today. The problem occurs every urination. The quality of the pain is described as burning. The pain is at a severity of 2/10. The pain is mild. Associated symptoms include flank pain, frequency, urgency and vomiting. Pertinent negatives include no hematuria or nausea. She has tried increased fluids for the symptoms. The treatment provided mild relief.      Review of Systems  Gastrointestinal:  Positive for vomiting. Negative for nausea.  Genitourinary:  Positive for dysuria, flank pain, frequency and urgency. Negative for hematuria.     Observations/Objective: No SOB or distress noted   Assessment and Plan: 1. UTI symptoms Force fluids AZO over the counter X2 days Pt will leave sample  Follow up if  symptoms worsen or do not improve  - Urinalysis, Complete     I discussed the assessment and treatment plan with the patient. The patient was provided an opportunity to ask questions and all were answered. The patient agreed with the plan and demonstrated an understanding of the instructions.   The patient was advised to call back or seek an in-person evaluation if the symptoms worsen or if the condition fails to improve as anticipated.  The above assessment and management plan was discussed with the patient. The patient verbalized understanding of and has agreed to the management plan. Patient is aware to call the clinic if symptoms persist or worsen. Patient is aware when to return to the clinic for a follow-up visit. Patient educated on when it is appropriate to go to the emergency department.    I provided 11 minutes of  non face-to-face time during this encounter.    Evelina Dun, FNP

## 2021-08-01 NOTE — Addendum Note (Signed)
Addended by: Zannie Cove on: 08/01/2021 04:29 PM   Modules accepted: Orders

## 2021-08-01 NOTE — Addendum Note (Signed)
Addended by: Evelina Dun A on: 08/01/2021 04:12 PM   Modules accepted: Orders

## 2021-08-05 ENCOUNTER — Encounter: Payer: Self-pay | Admitting: Obstetrics & Gynecology

## 2021-08-05 ENCOUNTER — Ambulatory Visit: Payer: No Typology Code available for payment source | Admitting: Obstetrics & Gynecology

## 2021-08-05 VITALS — BP 133/81 | HR 78 | Ht 64.0 in | Wt 129.2 lb

## 2021-08-05 DIAGNOSIS — Z3009 Encounter for other general counseling and advice on contraception: Secondary | ICD-10-CM

## 2021-08-05 DIAGNOSIS — D252 Subserosal leiomyoma of uterus: Secondary | ICD-10-CM | POA: Diagnosis not present

## 2021-08-05 DIAGNOSIS — N941 Unspecified dyspareunia: Secondary | ICD-10-CM | POA: Diagnosis not present

## 2021-08-05 NOTE — Progress Notes (Signed)
GYN VISIT Patient name: Meredith Hall MRN [id number]  Date of birth: 1991-01-16 Chief Complaint:   Follow-up  History of Present Illness:   Meredith Hall is a 31 y.o. G35P0010 female being seen today for follow up regarding:  Uterine fibroid: At this point, she wishes to discuss surgical intervention.  In review, pt had a miscarriage in April and a large subserosal fibroid was seen.  She has had a period since recent miscarriage- currently on menses.  Typically period last 5-6 days.  Typically will change a pad or tampon every 3-4 hours.  She does note significant dysmenorrhea and bloating.  Also notes dyspareunia.   She is concerned about pain/discomfort and future impact on pregnancy.  She desires to have fibroid removed.  Prior US reviewed and pictures shared with patient 06/24/21: enlarged heterogeneous uterus,large subserosal fibroid anterior right 8.6 x 6.3 x 8.1 cm,EEC 11.6 mm,normal ovaries,no pain during ultrasound,no free fluid  Patient's last menstrual period was 08/01/2021.     02/16/2021   11:34 AM 07/07/2020    9:04 AM 06/04/2020   12:25 PM 06/04/2020   12:15 PM 05/07/2020   11:41 AM  Depression screen PHQ 2/9  Decreased Interest 0 0 0 0 0  Down, Depressed, Hopeless 0 0 0 1 0  PHQ - 2 Score 0 0 0 1 0  Altered sleeping  0 0    Tired, decreased energy  0 0    Change in appetite  0 0    Feeling bad or failure about yourself   0 0    Trouble concentrating  0 0    Moving slowly or fidgety/restless  0 0    Suicidal thoughts  0 0    PHQ-9 Score  0 0    Difficult doing work/chores  Not difficult at all Not difficult at all       Review of Systems:   Pertinent items are noted in HPI Denies fever/chills, dizziness, headaches, visual disturbances, fatigue, shortness of breath, chest pain, abdominal pain, vomiting, no problems with bowel movements, urination, or unless otherwise stated above.  Pertinent History Reviewed:  Reviewed past medical,surgical, social,  obstetrical and family history.  Reviewed problem list, medications and allergies. Physical Assessment:   Vitals:   08/05/21 0909  BP: 133/81  Pulse: 78  Weight: 129 lb 3.2 oz (58.6 kg)  Height: '5\' 4"'$  (1.626 m)  Body mass index is 22.18 kg/m.       Physical Examination:   General appearance: alert, well appearing, and in no distress  Psych: mood appropriate, normal affect  Skin: warm & dry   Cardiovascular: normal heart rate noted  Respiratory: normal respiratory effort, no distress  Abdomen: soft, non-tender, enlarged uterus- below umbilicus, mobile, ~ 10XN size  Pelvic: examination not indicated  Extremities: no edema   Chaperone: N/A    Assessment & Plan:  1) Uterine leiomyoma, Fertility/family planning -reviewed myomectomy- discussed risk/benefit of procedure including risk of bleeding, infection and injury to surrounding organs -discussed overnight stay if desires and recovery 6-8wks -discussed postop waiting to conceive for 3-62mo -alternatives reviewed including expectant management and delaying surgery.  As of current suspect fibroid may not play role in fertility as she was able to become pregnant -Questions and concerns were reviewed, desires to proceed with myomectomy -discussed preop at hospital   Plan to schedule preop 2-3 wks prior to surgery, referral created  JJanyth Pupa DO Attending [facility]

## 2021-08-09 ENCOUNTER — Encounter: Payer: Self-pay | Admitting: Obstetrics & Gynecology

## 2021-09-06 ENCOUNTER — Other Ambulatory Visit: Payer: Self-pay | Admitting: *Deleted

## 2021-09-06 ENCOUNTER — Telehealth: Payer: Self-pay | Admitting: Family

## 2021-09-06 ENCOUNTER — Other Ambulatory Visit (HOSPITAL_COMMUNITY): Payer: Self-pay

## 2021-09-06 ENCOUNTER — Other Ambulatory Visit: Payer: Self-pay | Admitting: Family Medicine

## 2021-09-06 DIAGNOSIS — L2082 Flexural eczema: Secondary | ICD-10-CM

## 2021-09-06 MED ORDER — TRIAMCINOLONE ACETONIDE 0.1 % EX CREA
1.0000 | TOPICAL_CREAM | Freq: Two times a day (BID) | CUTANEOUS | 0 refills | Status: DC
  Filled 2021-09-06: qty 30, 15d supply, fill #0

## 2021-09-06 MED ORDER — TRIAMCINOLONE ACETONIDE 0.1 % EX CREA: 1.0000 | TOPICAL_CREAM | Freq: Two times a day (BID) | CUTANEOUS | 0 refills | Status: DC

## 2021-09-28 ENCOUNTER — Encounter: Payer: No Typology Code available for payment source | Admitting: Obstetrics & Gynecology

## 2021-09-30 ENCOUNTER — Encounter: Payer: Self-pay | Admitting: Obstetrics & Gynecology

## 2021-09-30 ENCOUNTER — Ambulatory Visit (INDEPENDENT_AMBULATORY_CARE_PROVIDER_SITE_OTHER): Payer: No Typology Code available for payment source | Admitting: Obstetrics & Gynecology

## 2021-09-30 VITALS — BP 124/74 | HR 73 | Ht 64.0 in | Wt 130.6 lb

## 2021-09-30 DIAGNOSIS — N941 Unspecified dyspareunia: Secondary | ICD-10-CM

## 2021-09-30 DIAGNOSIS — D252 Subserosal leiomyoma of uterus: Secondary | ICD-10-CM | POA: Diagnosis not present

## 2021-09-30 DIAGNOSIS — Z01818 Encounter for other preprocedural examination: Secondary | ICD-10-CM

## 2021-09-30 NOTE — Progress Notes (Signed)
   GYN VISIT Patient name: Meredith Hall MRN [id number]  Date of birth: [birth year] Chief Complaint:   Pre-op Exam  History of Present Illness:   Meredith Hall is a 31 y.o. G29P0010 female being seen today for preop appt- upcoming myomectomy on 10/4  In review, she notes Typically period last 5-6 days.  Typically will change a pad or tampon every 3-4 hours with considerable clots.  She does note significant dysmenorrhea and bloating.  Pain requires OTC supplements and pain medication.  Also notes dyspareunia.  Minimal changes since her last visit.  Due to pain/discomfort, HMB and impact on future pregnancies she desires to proceed with fibroid removal. Pt scheduled for myomectomy 10/4  Patient's last menstrual period was 09/30/2021.     02/16/2021   11:34 AM 07/07/2020    9:04 AM 06/04/2020   12:25 PM 06/04/2020   12:15 PM 05/07/2020   11:41 AM  Depression screen PHQ 2/9  Decreased Interest 0 0 0 0 0  Down, Depressed, Hopeless 0 0 0 1 0  PHQ - 2 Score 0 0 0 1 0  Altered sleeping  0 0    Tired, decreased energy  0 0    Change in appetite  0 0    Feeling bad or failure about yourself   0 0    Trouble concentrating  0 0    Moving slowly or fidgety/restless  0 0    Suicidal thoughts  0 0    PHQ-9 Score  0 0    Difficult doing work/chores  Not difficult at all Not difficult at all       Review of Systems:   Pertinent items are noted in HPI Denies fever/chills, dizziness, headaches, visual disturbances, fatigue, shortness of breath, chest pain, abdominal pain, vomiting, no problems with bowel movements, urination, or intercourse unless otherwise stated above.  Pertinent History Reviewed:  Reviewed past medical,surgical, social, obstetrical and family history.  Reviewed problem list, medications and allergies. Physical Assessment:   Vitals:   09/30/21 1125  BP: 124/74  Pulse: 73  Weight: 130 lb 9.6 oz (59.2 kg)  Height: '5\' 4"'$  (5.638 m)  Body mass index is 22.42  kg/m.       Physical Examination:   General appearance: alert, well appearing, and in no distress  Psych: mood appropriate, normal affect  Skin: warm & dry   Cardiovascular: normal heart rate noted  Respiratory: normal respiratory effort, no distress  Abdomen: soft, non-tender, palpable uterus below umbilicus  Pelvic: examination not indicated  Extremities: no edema   Chaperone: N/A    Assessment & Plan:  1) Subserosal fibroid, preop exam -reviewed risk/benefit and alternatives of surgery including but not limited to risk of bleeding, infection, injury to surrounding organs.  Discussed postponing pregnancy x 68mo and discussed likely need for C-section in the future.  Also discussed that fibroids may return -reviewed hospital expectations and recovery -questions/concerns were addressed -plan to proceed as scheduled -preop labs ordered -f/u 1 wk postop from surgery   Return for as scheduled.   JJanyth Pupa DO Attending [facility]

## 2021-10-04 DIAGNOSIS — Z029 Encounter for administrative examinations, unspecified: Secondary | ICD-10-CM

## 2021-10-12 NOTE — Patient Instructions (Addendum)
Meredith Hall  10/12/2021     '@PREFPERIOPPHARMACY'$ @   Your procedure is scheduled on 10/19/21.  Report to [facility] at 0600 A.M.  Call this number if you have problems the morning of surgery:  [phone]   Remember:  Do not eat or drink after midnight.    Take these medicines the morning of surgery with A SIP OF WATER zyrtec if needed.    Do not wear jewelry, make-up or nail polish.  Do not wear lotions, powders, or perfumes, or deodorant.  Do not shave 48 hours prior to surgery.  Men may shave face and neck.  Do not bring valuables to the hospital.  [facility] is not responsible for any belongings or valuables.  Contacts, dentures or bridgework may not be worn into surgery.  Leave your suitcase in the car.  After surgery it may be brought to your room.  For patients admitted to the hospital, discharge time will be determined by your treatment team.  Patients discharged the day of surgery will not be allowed to drive home.   Name and phone number of your driver:   family Special instructions:  Drink all of Ensure drink at 3:00am morning of procedure prior to coming into hospital.  Please read over the following fact sheets that you were given. Coughing and Deep Breathing, Surgical Site Infection Prevention, Anesthesia Post-op Instructions, and Care and Recovery After Surgery      PATIENT INSTRUCTIONS POST-ANESTHESIA  IMMEDIATELY FOLLOWING SURGERY:  Do not drive or operate machinery for the first twenty four hours after surgery.  Do not make any important decisions for twenty four hours after surgery or while taking narcotic pain medications or sedatives.  If you develop intractable nausea and vomiting or a severe headache please notify your doctor immediately.  FOLLOW-UP:  Please make an appointment with your surgeon as instructed. You do not need to follow up with anesthesia unless specifically instructed to do so.  WOUND CARE INSTRUCTIONS (if applicable):   Keep a dry clean dressing on the anesthesia/puncture wound site if there is drainage.  Once the wound has quit draining you may leave it open to air.  Generally you should leave the bandage intact for twenty four hours unless there is drainage.  If the epidural site drains for more than 36-48 hours please call the anesthesia department.  QUESTIONS?:  Please feel free to call your physician or the hospital operator if you have any questions, and they will be happy to assist you.      How to Use Chlorhexidine Before Surgery Chlorhexidine gluconate (CHG) is a germ-killing (antiseptic) solution that is used to clean the skin. It can get rid of the bacteria that normally live on the skin and can keep them away for about 24 hours. To clean your skin with CHG, you may be given: A CHG solution to use in the shower or as part of a sponge bath. A prepackaged cloth that contains CHG. Cleaning your skin with CHG may help lower the risk for infection: While you are staying in the intensive care unit of the hospital. If you have a vascular access, such as a central line, to provide short-term or long-term access to your veins. If you have a catheter to drain urine from your bladder. If you are on a ventilator. A ventilator is a machine that helps you breathe by moving air in and out of your lungs. After surgery. What are the risks? Risks of using CHG include:  A skin reaction. Hearing loss, if CHG gets in your ears and you have a perforated eardrum. Eye injury, if CHG gets in your eyes and is not rinsed out. The CHG product catching fire. Make sure that you avoid smoking and flames after applying CHG to your skin. Do not use CHG: If you have a chlorhexidine allergy or have previously reacted to chlorhexidine. On babies younger than 87 months of age. How to use CHG solution Use CHG only as told by your health care provider, and follow the instructions on the label. Use the full amount of CHG as directed.  Usually, this is one bottle. During a shower Follow these steps when using CHG solution during a shower (unless your health care provider gives you different instructions): Start the shower. Use your normal soap and shampoo to wash your face and hair. Turn off the shower or move out of the shower stream. Pour the CHG onto a clean washcloth. Do not use any type of brush or rough-edged sponge. Starting at your neck, lather your body down to your toes. Make sure you follow these instructions: If you will be having surgery, pay special attention to the part of your body where you will be having surgery. Scrub this area for at least 1 minute. Do not use CHG on your head or face. If the solution gets into your ears or eyes, rinse them well with water. Avoid your genital area. Avoid any areas of skin that have broken skin, cuts, or scrapes. Scrub your back and under your arms. Make sure to wash skin folds. Let the lather sit on your skin for 1-2 minutes or as long as told by your health care provider. Thoroughly rinse your entire body in the shower. Make sure that all body creases and crevices are rinsed well. Dry off with a clean towel. Do not put any substances on your body afterward--such as powder, lotion, or perfume--unless you are told to do so by your health care provider. Only use lotions that are recommended by the manufacturer. Put on clean clothes or pajamas. If it is the night before your surgery, sleep in clean sheets.  During a sponge bath Follow these steps when using CHG solution during a sponge bath (unless your health care provider gives you different instructions): Use your normal soap and shampoo to wash your face and hair. Pour the CHG onto a clean washcloth. Starting at your neck, lather your body down to your toes. Make sure you follow these instructions: If you will be having surgery, pay special attention to the part of your body where you will be having surgery. Scrub this  area for at least 1 minute. Do not use CHG on your head or face. If the solution gets into your ears or eyes, rinse them well with water. Avoid your genital area. Avoid any areas of skin that have broken skin, cuts, or scrapes. Scrub your back and under your arms. Make sure to wash skin folds. Let the lather sit on your skin for 1-2 minutes or as long as told by your health care provider. Using a different clean, wet washcloth, thoroughly rinse your entire body. Make sure that all body creases and crevices are rinsed well. Dry off with a clean towel. Do not put any substances on your body afterward--such as powder, lotion, or perfume--unless you are told to do so by your health care provider. Only use lotions that are recommended by the manufacturer. Put on clean clothes or pajamas.  If it is the night before your surgery, sleep in clean sheets. How to use CHG prepackaged cloths Only use CHG cloths as told by your health care provider, and follow the instructions on the label. Use the CHG cloth on clean, dry skin. Do not use the CHG cloth on your head or face unless your health care provider tells you to. When washing with the CHG cloth: Avoid your genital area. Avoid any areas of skin that have broken skin, cuts, or scrapes. Before surgery Follow these steps when using a CHG cloth to clean before surgery (unless your health care provider gives you different instructions): Using the CHG cloth, vigorously scrub the part of your body where you will be having surgery. Scrub using a back-and-forth motion for 3 minutes. The area on your body should be completely wet with CHG when you are done scrubbing. Do not rinse. Discard the cloth and let the area air-dry. Do not put any substances on the area afterward, such as powder, lotion, or perfume. Put on clean clothes or pajamas. If it is the night before your surgery, sleep in clean sheets.  For general bathing Follow these steps when using CHG  cloths for general bathing (unless your health care provider gives you different instructions). Use a separate CHG cloth for each area of your body. Make sure you wash between any folds of skin and between your fingers and toes. Wash your body in the following order, switching to a new cloth after each step: The front of your neck, shoulders, and chest. Both of your arms, under your arms, and your hands. Your stomach and groin area, avoiding the genitals. Your right leg and foot. Your left leg and foot. The back of your neck, your back, and your buttocks. Do not rinse. Discard the cloth and let the area air-dry. Do not put any substances on your body afterward--such as powder, lotion, or perfume--unless you are told to do so by your health care provider. Only use lotions that are recommended by the manufacturer. Put on clean clothes or pajamas. Contact a health care provider if: Your skin gets irritated after scrubbing. You have questions about using your solution or cloth. You swallow any chlorhexidine. Call your local poison control center (1-3524085990 in the [place] Get help right away if: Your eyes itch badly, or they become very red or swollen. Your skin itches badly and is red or swollen. Your hearing changes. You have trouble seeing. You have swelling or tingling in your mouth or throat. You have trouble breathing. These symptoms may represent a serious problem that is an emergency. Do not wait to see if the symptoms will go away. Get medical help right away. Call your local emergency services (911 in the [place] Do not drive yourself to the hospital. Summary Chlorhexidine gluconate (CHG) is a germ-killing (antiseptic) solution that is used to clean the skin. Cleaning your skin with CHG may help to lower your risk for infection. You may be given CHG to use for bathing. It may be in a bottle or in a prepackaged cloth to use on your skin. Carefully follow your health care provider's  instructions and the instructions on the product label. Do not use CHG if you have a chlorhexidine allergy. Contact your health care provider if your skin gets irritated after scrubbing. This information is not intended to replace advice given to you by your health care provider. Make sure you discuss any questions you have with your health care provider. Document  Revised: 05/02/2021 Document Reviewed: 03/15/2020 Elsevier Patient Education  [place]. Myomectomy  Myomectomy is a surgery to remove a noncancerous fibroid (myoma) from the uterus. Myomas are tumors made up of fibrous tissue. They are often called fibroid tumors. They can range from the size of a pea to the size of a grapefruit. In a myomectomy, the fibroid tumor is removed without removing the uterus. This surgery is usually done if the tumor is growing or causing symptoms, such as pain, pressure, bleeding, pain during intercourse, or problems getting pregnant. Tell a health care provider about: Any allergies you have. All medicines you are taking, including vitamins, herbs, eye drops, creams, and over-the-counter medicines. Any problems you or family members have had with anesthetic medicines. Any blood disorders you have. Any surgeries you have had. Any medical conditions you have. Whether you are pregnant or may be pregnant. What are the risks? Generally, this is a safe procedure. However, problems may occur, including: Bleeding. Infection. Allergic reactions to medicines. Damage to nearby structures or organs. Blood clots in the legs, chest, or brain. Scar tissue on nearby organs and in the pelvis. This may require another surgery to remove the scar tissue. What happens before the procedure? Staying hydrated Follow instructions from your health care provider about hydration, which may include: Up to 2 hours before the procedure - you may continue to drink clear liquids, such as water, clear fruit juice, black  coffee, and plain tea.  Eating and drinking restrictions Follow instructions from your health care provider about eating and drinking, which may include: 8 hours before the procedure - stop eating heavy meals or foods, such as meat, fried foods, or fatty foods. 6 hours before the procedure - stop eating light meals or foods, such as toast or cereal. 6 hours before the procedure - stop drinking milk or drinks that contain milk. 2 hours before the procedure - stop drinking clear liquids. Medicines Ask your health care provider about: Changing or stopping your regular medicines. This is especially important if you are taking diabetes medicines or blood thinners. Taking medicines such as aspirin and ibuprofen. These medicines can thin your blood. Do not take these medicines unless your health care provider tells you to take them. Taking over-the-counter medicines, vitamins, herbs, and supplements. General instructions Do not use any products that contain nicotine or tobacco for at least 4 weeks before the procedure. These products include cigarettes, chewing tobacco, and vaping devices, such as e-cigarettes. If you need help quitting, ask your health care provider. Plan to have a responsible adult take you home from the hospital or clinic. Plan to have a responsible adult care for you for the time you are told after you leave the hospital or clinic. This is important. Ask your health care provider what steps will be taken to help prevent infection. These steps may include: Removing hair at the surgery site. Washing skin with a germ-killing soap. Taking antibiotic medicine. What happens during the procedure? An IV will be inserted into one of your veins. You will be given one or more of the following: A medicine to help you relax (sedative). A medicine to make you fall asleep (general anesthetic). A small, thin tube (catheter) will be inserted into your bladder to drain urine. One of the  following methods will be used to remove the fibroid. The method used will depend on the size, shape, location, and number of fibroids. Hysteroscopic myomectomy. This may be done when the fibroid tumor is inside  the cavity of the uterus. A long, thin tube with a lens (hysteroscope) will be inserted into the uterus through the vagina. A salt and water solution called saline will be put into the uterus. This will expand the uterus and allow the surgeon to see the fibroids. Tools will be passed through the hysteroscope to remove the fibroid tumor in pieces. Laparoscopic myomectomy. A few small incisions will be made in the lower abdomen. A thin, lighted tube with a camera (laparoscope) will be inserted through one of the incisions. This will give the surgeon a good view of the area. The fibroid tumor will be removed through the other incisions. The incisions will then be closed with stitches (sutures) or staples. Abdominal myomectomy. This is done when the fibroid tumor cannot be removed with a hysteroscope or laparoscope. The fibroid tumor will be removed through a larger incision that is made in the abdomen. The incision will be closed with sutures or staples. Recovery time will be longer with this method. These procedures may vary among health care providers and hospitals. What can I expect after the procedure? Your blood pressure, heart rate, breathing rate, and blood oxygen level will be monitored until you leave the hospital or clinic. The IV and catheter will remain inserted for a period of time. You may be given medicine for pain as needed. You may be given an antibiotic medicine if needed. If you were given a sedative during the procedure, it can affect you for several hours. Do not drive or operate machinery until your health care provider says that it is safe. Summary Myomectomy is a surgery to remove a noncancerous fibroid (myoma) from the uterus. This surgery is usually done if the tumor is  growing or causing symptoms, such as pain, pressure, bleeding, pain during intercourse, or problems getting pregnant. Follow instructions from your health care provider about eating and drinking before the procedure. Recovery time depends on the method used in this procedure. The abdominal method will require a longer recovery. This information is not intended to replace advice given to you by your health care provider. Make sure you discuss any questions you have with your health care provider. Document Revised: 08/07/2019 Document Reviewed: 08/07/2019 Elsevier Patient Education  [place].

## 2021-10-14 ENCOUNTER — Encounter (HOSPITAL_COMMUNITY)
Admission: RE | Admit: 2021-10-14 | Discharge: 2021-10-14 | Disposition: A | Discharge status: Home / Self Care | Payer: No Typology Code available for payment source | Source: Ambulatory Visit | Attending: Obstetrics & Gynecology | Admitting: Obstetrics & Gynecology

## 2021-10-14 DIAGNOSIS — D252 Subserosal leiomyoma of uterus: Secondary | ICD-10-CM | POA: Insufficient documentation

## 2021-10-14 DIAGNOSIS — Z01818 Encounter for other preprocedural examination: Secondary | ICD-10-CM

## 2021-10-14 DIAGNOSIS — Z01812 Encounter for preprocedural laboratory examination: Secondary | ICD-10-CM | POA: Diagnosis not present

## 2021-10-14 LAB — CBC
HCT: 41.2 % (ref 36.0–46.0)
Hemoglobin: 14.1 g/dL (ref 12.0–15.0)
MCH: 29.3 pg (ref 26.0–34.0)
MCHC: 34.2 g/dL (ref 30.0–36.0)
MCV: 85.5 fL (ref 80.0–100.0)
Platelets: 234 10*3/uL (ref 150–400)
RBC: 4.82 MIL/uL (ref 3.87–5.11)
RDW: 12 % (ref 11.5–15.5)
WBC: 5.7 10*3/uL (ref 4.0–10.5)
nRBC: 0 % (ref 0.0–0.2)

## 2021-10-14 LAB — COMPREHENSIVE METABOLIC PANEL
ALT: 14 U/L (ref 0–44)
AST: 22 U/L (ref 15–41)
Albumin: 4.3 g/dL (ref 3.5–5.0)
Alkaline Phosphatase: 59 U/L (ref 38–126)
Anion gap: 9 (ref 5–15)
BUN: 14 mg/dL (ref 6–20)
CO2: 23 mmol/L (ref 22–32)
Calcium: 9.4 mg/dL (ref 8.9–10.3)
Chloride: 107 mmol/L (ref 98–111)
Creatinine, Ser: 0.59 mg/dL (ref 0.44–1.00)
GFR, Estimated: >60 mL/min (ref >60)
Glucose, Bld: 81 mg/dL (ref 70–99)
Potassium: 4.2 mmol/L (ref 3.5–5.1)
Sodium: 139 mmol/L (ref 135–145)
Total Bilirubin: 1.4 mg/dL — ABNORMAL HIGH (ref 0.3–1.2)
Total Protein: 7.7 g/dL (ref 6.5–8.1)

## 2021-10-14 LAB — TYPE AND SCREEN
ABO/RH(D): O POS
Antibody Screen: NEGATIVE

## 2021-10-14 LAB — PREGNANCY, URINE: Preg Test, Ur: NEGATIVE

## 2021-10-17 ENCOUNTER — Encounter: Payer: Self-pay | Admitting: Obstetrics & Gynecology

## 2021-10-18 NOTE — H&P (Signed)
[facility] Attending History and Physical  Meredith Hall is a 31 y.o. G1P0010 who presents for scheduled myomectomy  In review, menses last for about 5-6 days with moderate to heavy bleeding.  Typically will need to change a pad every 3-4 hours with clots.  She also notes significant dysmenorrhea.  She has tried OTC remedies with minimal improvement.  After recent miscarriage, US revealed a large uterine fibroid.  Due to pain/discomfort, HMB and impact on future pregnancies she desires to proceed with fibroid removal. ***  Denies any abnormal vaginal discharge, fevers, chills, sweats, dysuria, nausea, vomiting, other GI or GU symptoms or other general symptoms.  Past Medical History:  Diagnosis Date   Eczema    Miscarriage    Past Surgical History:  Procedure Laterality Date   WISDOM TOOTH EXTRACTION     OB History  Gravida Para Term Preterm AB Living  1       1    SAB IAB Ectopic Multiple Live Births  1            # Outcome Date GA Lbr Len/2nd Weight Sex Delivery Anes PTL Lv  1 SAB 2023          Patient denies any other pertinent gynecologic issues.  No current facility-administered medications on file prior to encounter.   Current Outpatient Medications on File Prior to Encounter  Medication Sig Dispense Refill   cetirizine (ZYRTEC) 10 MG tablet Take 10 mg by mouth daily.     ibuprofen (ADVIL) 200 MG tablet Take 200 mg by mouth every 6 (six) hours as needed for moderate pain or headache.     mometasone (NASONEX) 50 MCG/ACT nasal spray Place 2 sprays into the nose daily as needed (allergies).     Vitamin D, Ergocalciferol, (DRISDOL) 1.25 MG (50000 UNIT) CAPS capsule Take 1 capsule by mouth every 7 days. 12 capsule 3   fluconazole (DIFLUCAN) 150 MG tablet Take 1 tablet (150 mg total) by mouth every 3 (three) days. (Patient not taking: Reported on 09/30/2021) 3 tablet 0   Allergies  Allergen Reactions   Shrimp [Shellfish Allergy]     Red,  itching rash    Social History:   reports that she has never smoked. She has never used smokeless tobacco. She reports that she does not drink alcohol and does not use drugs. Family History  Problem Relation Age of Onset   Diabetes Father    Thyroid disease Mother     Review of Systems: Pertinent items noted in HPI and remainder of comprehensive ROS otherwise negative.  PHYSICAL EXAM: There were no vitals taken for this visit. *** CONSTITUTIONAL: Well-developed, well-nourished female in no acute distress.  SKIN: Skin is warm and dry. No rash noted. Not diaphoretic. No erythema. No pallor. NEUROLOGIC: Alert and oriented to person, place, and time. Normal reflexes, muscle tone coordination. No cranial nerve deficit noted. PSYCHIATRIC: Normal mood and affect. Normal behavior. Normal judgment and thought content. CARDIOVASCULAR: Normal heart rate noted, regular rhythm RESPIRATORY: Effort and breath sounds normal, no problems with respiration noted ABDOMEN: Soft, nontender, nondistended. PELVIC: deferred MUSCULOSKELETAL: no calf tenderness bilaterally EXT: no edema bilaterally, normal pulses  Labs: Results for orders placed or performed during the hospital encounter of 10/14/21 (from the past 336 hour(s))  Pregnancy, urine   Collection Time: 10/14/21  8:35 AM  Result Value Ref Range   Preg Test, Ur NEGATIVE NEGATIVE  Type and screen   Collection Time: 10/14/21  8:35 AM  Result Value Ref Range   ABO/RH(D) O POS    Antibody Screen NEG    Sample Expiration 10/28/2021,2359    Extend sample reason      NO TRANSFUSIONS OR PREGNANCY IN THE PAST 3 MONTHS Performed at [facility], [address] [place], [place] [address]   CBC   Collection Time: 10/14/21  9:00 AM  Result Value Ref Range   WBC 5.7 4.0 - 10.5 K/uL   RBC 4.82 3.87 - 5.11 MIL/uL   Hemoglobin 14.1 12.0 - 15.0 g/dL   HCT 41.2 36.0 - 46.0 %   MCV 85.5 80.0 - 100.0 fL   MCH 29.3 26.0 - 34.0 pg   MCHC 34.2 30.0 - 36.0  g/dL   RDW 12.0 11.5 - 15.5 %   Platelets 234 150 - 400 K/uL   nRBC 0.0 0.0 - 0.2 %  Comprehensive metabolic panel   Collection Time: 10/14/21  9:00 AM  Result Value Ref Range   Sodium 139 135 - 145 mmol/L   Potassium 4.2 3.5 - 5.1 mmol/L   Chloride 107 98 - 111 mmol/L   CO2 23 22 - 32 mmol/L   Glucose, Bld 81 70 - 99 mg/dL   BUN 14 6 - 20 mg/dL   Creatinine, Ser 0.59 0.44 - 1.00 mg/dL   Calcium 9.4 8.9 - 10.3 mg/dL   Total Protein 7.7 6.5 - 8.1 g/dL   Albumin 4.3 3.5 - 5.0 g/dL   AST 22 15 - 41 U/L   ALT 14 0 - 44 U/L   Alkaline Phosphatase 59 38 - 126 U/L   Total Bilirubin 1.4 (H) 0.3 - 1.2 mg/dL   GFR, Estimated >60 >60 mL/min   Anion gap 9 5 - 15    Imaging Studies: TVUS 06/2021: Enlarged heterogeneous uterus,large subserosal fibroid anterior right 8.6 x 6.3 x 8.1 cm,EEC 11.6 mm,normal ovaries  Assessment: Heavy menstrual bleeding Dysmenorrhea Subserosal fibroid  Plan: Abdominal myomectomy -Toradol preop -Ancef 2g IV -NPO -LR @ 125cc/hr -SCDs to OR -Risk/benefits and alternatives reviewed with the patient including but not limited to risk of bleeding, infection and injury to surrounding organs.  Questions and concerns were addressed and pt desires to proceed  Janyth Pupa, DO Attending [place], [facility] for [facility], [place]

## 2021-10-19 ENCOUNTER — Other Ambulatory Visit: Payer: Self-pay

## 2021-10-19 ENCOUNTER — Inpatient Hospital Stay (HOSPITAL_COMMUNITY): Payer: No Typology Code available for payment source | Admitting: Anesthesiology

## 2021-10-19 ENCOUNTER — Encounter (HOSPITAL_COMMUNITY): Payer: Self-pay | Admitting: Obstetrics & Gynecology

## 2021-10-19 ENCOUNTER — Encounter (HOSPITAL_COMMUNITY)
Admission: RE | Disposition: A | Discharge status: Home / Self Care | Payer: Self-pay | Source: Home / Self Care | Attending: Obstetrics & Gynecology

## 2021-10-19 ENCOUNTER — Observation Stay (HOSPITAL_COMMUNITY)
Admission: RE | Admit: 2021-10-19 | Discharge: 2021-10-20 | Disposition: A | Discharge status: Home / Self Care | Payer: No Typology Code available for payment source | Attending: Obstetrics & Gynecology | Admitting: Obstetrics & Gynecology

## 2021-10-19 DIAGNOSIS — N941 Unspecified dyspareunia: Secondary | ICD-10-CM | POA: Diagnosis not present

## 2021-10-19 DIAGNOSIS — N939 Abnormal uterine and vaginal bleeding, unspecified: Secondary | ICD-10-CM | POA: Diagnosis not present

## 2021-10-19 DIAGNOSIS — D252 Subserosal leiomyoma of uterus: Secondary | ICD-10-CM | POA: Diagnosis not present

## 2021-10-19 DIAGNOSIS — D259 Leiomyoma of uterus, unspecified: Secondary | ICD-10-CM | POA: Diagnosis not present

## 2021-10-19 HISTORY — PX: MYOMECTOMY: SHX85

## 2021-10-19 SURGERY — MYOMECTOMY, ABDOMINAL APPROACH
Anesthesia: General | Site: Abdomen

## 2021-10-19 MED ORDER — KETOROLAC TROMETHAMINE 30 MG/ML IJ SOLN
30.0000 mg | Freq: Four times a day (QID) | INTRAMUSCULAR | Status: AC
  Administered 2021-10-19 – 2021-10-20 (×4): 30 mg via INTRAVENOUS
  Filled 2021-10-19 (×4): qty 1

## 2021-10-19 MED ORDER — POVIDONE-IODINE 10 % EX SWAB: 2.0000 | Freq: Once | CUTANEOUS | Status: DC

## 2021-10-19 MED ORDER — OXYCODONE HCL 5 MG PO TABS: 5.0000 mg | ORAL_TABLET | Freq: Four times a day (QID) | ORAL | Status: DC | PRN

## 2021-10-19 MED ORDER — SCOPOLAMINE 1 MG/3DAYS TD PT72
1.0000 | MEDICATED_PATCH | Freq: Once | TRANSDERMAL | Status: DC
  Administered 2021-10-19: 1.5 mg via TRANSDERMAL

## 2021-10-19 MED ORDER — FENTANYL CITRATE (PF) 250 MCG/5ML IJ SOLN
INTRAMUSCULAR | Status: AC
  Filled 2021-10-19: qty 5

## 2021-10-19 MED ORDER — MIDAZOLAM HCL 2 MG/2ML IJ SOLN
2.0000 mg | Freq: Once | INTRAMUSCULAR | Status: AC
  Administered 2021-10-19: 2 mg via INTRAVENOUS

## 2021-10-19 MED ORDER — PROPOFOL 10 MG/ML IV BOLUS
INTRAVENOUS | Status: AC
  Filled 2021-10-19: qty 20

## 2021-10-19 MED ORDER — VASOPRESSIN 20 UNIT/ML IV SOLN
INTRAVENOUS | Status: DC | PRN
  Administered 2021-10-19: 17 mL via INTRAMUSCULAR

## 2021-10-19 MED ORDER — SCOPOLAMINE 1 MG/3DAYS TD PT72
MEDICATED_PATCH | TRANSDERMAL | Status: AC
  Filled 2021-10-19: qty 1

## 2021-10-19 MED ORDER — LIDOCAINE HCL (PF) 2 % IJ SOLN
INTRAMUSCULAR | Status: AC
  Filled 2021-10-19: qty 5

## 2021-10-19 MED ORDER — KETOROLAC TROMETHAMINE 15 MG/ML IJ SOLN: 30.0000 mg | INTRAMUSCULAR | Status: DC

## 2021-10-19 MED ORDER — MIDAZOLAM HCL 2 MG/2ML IJ SOLN
INTRAMUSCULAR | Status: AC
  Filled 2021-10-19: qty 2

## 2021-10-19 MED ORDER — GABAPENTIN 300 MG PO CAPS
300.0000 mg | ORAL_CAPSULE | Freq: Three times a day (TID) | ORAL | Status: DC
  Administered 2021-10-19 – 2021-10-20 (×4): 300 mg via ORAL
  Filled 2021-10-19 (×4): qty 1

## 2021-10-19 MED ORDER — PROPOFOL 10 MG/ML IV BOLUS
INTRAVENOUS | Status: DC | PRN
  Administered 2021-10-19: 150 mg via INTRAVENOUS

## 2021-10-19 MED ORDER — VASOPRESSIN 20 UNIT/ML IV SOLN
INTRAVENOUS | Status: AC
  Filled 2021-10-19: qty 1

## 2021-10-19 MED ORDER — MIDAZOLAM HCL 5 MG/5ML IJ SOLN
INTRAMUSCULAR | Status: DC | PRN
  Administered 2021-10-19: 1 mg via INTRAVENOUS

## 2021-10-19 MED ORDER — ONDANSETRON HCL 4 MG/2ML IJ SOLN
INTRAMUSCULAR | Status: DC | PRN
  Administered 2021-10-19: 4 mg via INTRAVENOUS

## 2021-10-19 MED ORDER — DEXAMETHASONE SODIUM PHOSPHATE 10 MG/ML IJ SOLN
INTRAMUSCULAR | Status: DC | PRN
  Administered 2021-10-19: 10 mg via INTRAVENOUS

## 2021-10-19 MED ORDER — SUGAMMADEX SODIUM 200 MG/2ML IV SOLN
INTRAVENOUS | Status: DC | PRN
  Administered 2021-10-19: 118 mg via INTRAVENOUS

## 2021-10-19 MED ORDER — FENTANYL CITRATE (PF) 250 MCG/5ML IJ SOLN
INTRAMUSCULAR | Status: DC | PRN
  Administered 2021-10-19: 50 ug via INTRAVENOUS
  Administered 2021-10-19 (×4): 25 ug via INTRAVENOUS

## 2021-10-19 MED ORDER — LACTATED RINGERS IV SOLN: INTRAVENOUS | Status: DC

## 2021-10-19 MED ORDER — ROCURONIUM BROMIDE 10 MG/ML (PF) SYRINGE
PREFILLED_SYRINGE | INTRAVENOUS | Status: AC
  Filled 2021-10-19: qty 10

## 2021-10-19 MED ORDER — MEPERIDINE HCL 50 MG/ML IJ SOLN: 6.2500 mg | INTRAMUSCULAR | Status: DC | PRN

## 2021-10-19 MED ORDER — ONDANSETRON HCL 4 MG/2ML IJ SOLN: 4.0000 mg | Freq: Four times a day (QID) | INTRAMUSCULAR | Status: DC | PRN

## 2021-10-19 MED ORDER — ONDANSETRON HCL 4 MG PO TABS: 4.0000 mg | ORAL_TABLET | Freq: Four times a day (QID) | ORAL | Status: DC | PRN

## 2021-10-19 MED ORDER — CEFAZOLIN SODIUM-DEXTROSE 2-4 GM/100ML-% IV SOLN
INTRAVENOUS | Status: AC
  Filled 2021-10-19: qty 100

## 2021-10-19 MED ORDER — HEMOSTATIC AGENTS (NO CHARGE) OPTIME
TOPICAL | Status: DC | PRN
  Administered 2021-10-19: 1 via TOPICAL

## 2021-10-19 MED ORDER — 0.9 % SODIUM CHLORIDE (POUR BTL) OPTIME
TOPICAL | Status: DC | PRN
  Administered 2021-10-19: 1000 mL

## 2021-10-19 MED ORDER — CHLORHEXIDINE GLUCONATE 0.12 % MT SOLN
15.0000 mL | Freq: Once | OROMUCOSAL | Status: AC
  Administered 2021-10-19: 15 mL via OROMUCOSAL

## 2021-10-19 MED ORDER — SIMETHICONE 80 MG PO CHEW
80.0000 mg | CHEWABLE_TABLET | Freq: Four times a day (QID) | ORAL | Status: DC | PRN
  Administered 2021-10-19: 80 mg via ORAL
  Filled 2021-10-19: qty 1

## 2021-10-19 MED ORDER — DEXAMETHASONE SODIUM PHOSPHATE 10 MG/ML IJ SOLN
INTRAMUSCULAR | Status: AC
  Filled 2021-10-19: qty 1

## 2021-10-19 MED ORDER — IBUPROFEN 600 MG PO TABS: 600.0000 mg | ORAL_TABLET | Freq: Four times a day (QID) | ORAL | Status: DC

## 2021-10-19 MED ORDER — HYDROMORPHONE HCL 1 MG/ML IJ SOLN
0.2500 mg | INTRAMUSCULAR | Status: DC | PRN
  Administered 2021-10-19 (×2): 0.5 mg via INTRAVENOUS
  Filled 2021-10-19 (×2): qty 0.5

## 2021-10-19 MED ORDER — ORAL CARE MOUTH RINSE: 15.0000 mL | Freq: Once | OROMUCOSAL | Status: AC

## 2021-10-19 MED ORDER — ROCURONIUM BROMIDE 10 MG/ML (PF) SYRINGE
PREFILLED_SYRINGE | INTRAVENOUS | Status: DC | PRN
  Administered 2021-10-19: 20 mg via INTRAVENOUS
  Administered 2021-10-19: 50 mg via INTRAVENOUS

## 2021-10-19 MED ORDER — ACETAMINOPHEN 325 MG PO TABS
650.0000 mg | ORAL_TABLET | Freq: Four times a day (QID) | ORAL | Status: DC
  Administered 2021-10-19 – 2021-10-20 (×4): 650 mg via ORAL
  Filled 2021-10-19 (×4): qty 2

## 2021-10-19 MED ORDER — KETOROLAC TROMETHAMINE 30 MG/ML IJ SOLN
INTRAMUSCULAR | Status: AC
  Administered 2021-10-19: 30 mg
  Filled 2021-10-19: qty 1

## 2021-10-19 MED ORDER — ONDANSETRON HCL 4 MG/2ML IJ SOLN: 4.0000 mg | Freq: Once | INTRAMUSCULAR | Status: DC | PRN

## 2021-10-19 MED ORDER — CEFAZOLIN SODIUM-DEXTROSE 2-4 GM/100ML-% IV SOLN
2.0000 g | INTRAVENOUS | Status: AC
  Administered 2021-10-19: 2 g via INTRAVENOUS

## 2021-10-19 MED ORDER — MENTHOL 3 MG MT LOZG: 1.0000 | LOZENGE | OROMUCOSAL | Status: DC | PRN

## 2021-10-19 MED ORDER — DOCUSATE SODIUM 100 MG PO CAPS
100.0000 mg | ORAL_CAPSULE | Freq: Two times a day (BID) | ORAL | Status: DC
  Administered 2021-10-19 – 2021-10-20 (×3): 100 mg via ORAL
  Filled 2021-10-19 (×3): qty 1

## 2021-10-19 MED ORDER — ONDANSETRON HCL 4 MG/2ML IJ SOLN
INTRAMUSCULAR | Status: AC
  Filled 2021-10-19: qty 2

## 2021-10-19 MED ORDER — LIDOCAINE 2% (20 MG/ML) 5 ML SYRINGE
INTRAMUSCULAR | Status: DC | PRN
  Administered 2021-10-19: 60 mg via INTRAVENOUS

## 2021-10-19 SURGICAL SUPPLY — 41 items
BAG DECANTER FOR FLEXI CONT (MISCELLANEOUS) IMPLANT
CLOTH BEACON ORANGE TIMEOUT ST (SAFETY) ×1 IMPLANT
COVER LIGHT HANDLE STERIS (MISCELLANEOUS) ×2 IMPLANT
DERMABOND ADVANCED .7 DNX12 (GAUZE/BANDAGES/DRESSINGS) IMPLANT
DRSG OPSITE POSTOP 4X8 (GAUZE/BANDAGES/DRESSINGS) IMPLANT
DURAPREP 26ML APPLICATOR (WOUND CARE) IMPLANT
ELECT NDL TIP 2.8 STRL (NEEDLE) IMPLANT
ELECT NEEDLE TIP 2.8 STRL (NEEDLE) ×1 IMPLANT
GAUZE 4X4 16PLY ~~LOC~~+RFID DBL (SPONGE) IMPLANT
GLOVE BIO SURGEON STRL SZ 6.5 (GLOVE) ×1 IMPLANT
GLOVE BIO SURGEON STRL SZ7 (GLOVE) IMPLANT
GLOVE BIO SURGEON STRL SZ8 (GLOVE) IMPLANT
GLOVE BIOGEL PI IND STRL 6.5 (GLOVE) IMPLANT
GLOVE BIOGEL PI IND STRL 7.0 (GLOVE) ×3 IMPLANT
GLOVE BIOGEL PI IND STRL 8 (GLOVE) IMPLANT
GLOVE ECLIPSE 8.0 STRL XLNG CF (GLOVE) IMPLANT
GOWN STRL REUS W/ TWL LRG LVL3 (GOWN DISPOSABLE) ×3 IMPLANT
GOWN STRL REUS W/TWL LRG LVL3 (GOWN DISPOSABLE) ×1 IMPLANT
HEMOSTAT SURGICEL 4X8 (HEMOSTASIS) IMPLANT
KIT TURNOVER KIT A (KITS) ×1 IMPLANT
NDL HYPO 18GX1.5 BLUNT FILL (NEEDLE) IMPLANT
NDL HYPO 25X1 1.5 SAFETY (NEEDLE) IMPLANT
NEEDLE HYPO 18GX1.5 BLUNT FILL (NEEDLE) ×1 IMPLANT
NEEDLE HYPO 25X1 1.5 SAFETY (NEEDLE) ×1 IMPLANT
NS IRRIG 1000ML POUR BTL (IV SOLUTION) ×1 IMPLANT
PACK ABDOMINAL MAJOR (CUSTOM PROCEDURE TRAY) ×1 IMPLANT
PAD ARMBOARD 7.5X6 YLW CONV (MISCELLANEOUS) ×1 IMPLANT
RETRACTOR WOUND ALXS 18CM MED (MISCELLANEOUS) IMPLANT
RTRCTR WOUND ALEXIS O 18CM MED (MISCELLANEOUS) ×1
SET BASIN LINEN APH (SET/KITS/TRAYS/PACK) ×1 IMPLANT
SPONGE T-LAP 18X18 ~~LOC~~+RFID (SPONGE) IMPLANT
SUT CHROMIC 3 0 SH 27 (SUTURE) IMPLANT
SUT MON AB 3-0 SH 27 (SUTURE) IMPLANT
SUT VIC AB 0 CT1 27 (SUTURE) ×3
SUT VIC AB 0 CT1 27XBRD ANBCTR (SUTURE) ×2 IMPLANT
SUT VIC AB 0 CT1 27XCR 8 STRN (SUTURE) IMPLANT
SUT VIC AB 4-0 KS 27 (SUTURE) ×1 IMPLANT
SYR 10ML LL (SYRINGE) IMPLANT
SYR CONTROL 10ML LL (SYRINGE) ×1 IMPLANT
TOWEL ~~LOC~~+RFID 17X26 BLUE (SPONGE) IMPLANT
TRAY FOLEY W/BAG SLVR 14FR (SET/KITS/TRAYS/PACK) ×1 IMPLANT

## 2021-10-19 NOTE — Anesthesia Procedure Notes (Signed)
Procedure Name: Intubation Date/Time: 10/19/2021 7:49 AM  Performed by: Maude Leriche, CRNAPre-anesthesia Checklist: Patient identified, Emergency Drugs available, Suction available and Patient being monitored Patient Re-evaluated:Patient Re-evaluated prior to induction Oxygen Delivery Method: Circle system utilized Preoxygenation: Pre-oxygenation with 100% oxygen Induction Type: IV induction Ventilation: Mask ventilation without difficulty Laryngoscope Size: Miller and 2 Grade View: Grade I Tube type: Oral Tube size: 7.0 mm Number of attempts: 1 Airway Equipment and Method: Stylet and Bite block Placement Confirmation: ETT inserted through vocal cords under direct vision, positive ETCO2 and breath sounds checked- equal and bilateral Secured at: 21 cm Tube secured with: Tape Dental Injury: Teeth and Oropharynx as per pre-operative assessment

## 2021-10-19 NOTE — Op Note (Signed)
OPERATIVE NOTE   Preoperative Diagnosis: Uterine fibroids, Abnormal uterine bleeding,   Postoperative Diagnosis: same   Procedure: abdominal myomectomy   Surgeon: Dr. Janyth Pupa Assistant: Dr. Darlis Loan   EBL: 100cc UOP: 100cc IVF: 1000cc   Findings: Enlarged uterus with ~8cm subserosal fibroid.  Normal fallopian tubes and ovaries bilaterally.    Specimen: Uterine fibroid     Procedure: The patient was taken to the operating room where a general anesthetic was given. A timeout was performed confirming the appropriate procedure. Her perineum and vagina were prepped with Betadine. Foley catheter was placed in the bladder. The patient's abdomen was prepped and draped in the usual fashion.  A low transverse incision was made in the lower abdomen and carried sharply through to the fascia.  The fascia was incised in the midline and the incision was extended laterally. The superior aspect of the fascial incision bluntly dissected off the underlying rectus muscle.  The rectus muscles were then separated in the midline, the peritoneum identified, tented up and entered sharply with the Metzenbaum scissors.  The peritoneal incision was extended.  Findings as noted avoe.  The surface of the uterus was injected with a diluted solution of Pitressin (20 units in 100cc saline). A vertical incision was made.  Using both blunt and sharp dissection, the fibroid was removed.  The defect in the uterus was closed using interrupted sutures of 0 Vicryl in several layers. Hemostasis was noted. A final layer of closure of both incisions was performed using 3-0 monocryl in a baseball stitch fashion. Excellent hemostasis was noted. The pelvis was irrigated. Hemostasis was confirmed. Surgicel was placed over the uterine incision. All instruments were removed from the abdominal cavity. The fascia was closed in a running fashion using 0 vicryl.  The subcutaneous layer was closed using 3-0 plain gut.  The skin was  reapproximated using a subcuticular suture of 4-0 vicryl on a Keith needle. Sponge, needle, and instrument counts were correct.  The patient tolerated her procedure well. She was awakened from her anesthetic without difficulty. She was transported to the recovery room in stable condition.    An experienced assistant was required given the standard of surgical care given the complexity of the case.  This assistant was needed for exposure, dissection, suctioning, retraction, instrument exchange, and for overall help during the procedure.   Janyth Pupa, DO Attending [place], [facility] for [employer], [place]

## 2021-10-19 NOTE — Plan of Care (Signed)

## 2021-10-19 NOTE — Anesthesia Postprocedure Evaluation (Signed)
Anesthesia Post Note  Patient: CARNITA GOLOB  Procedure(s) Performed: ABDOMINAL MYOMECTOMY (Abdomen)  Patient location during evaluation: Phase II Anesthesia Type: General Level of consciousness: awake and alert and oriented Pain management: pain level controlled Vital Signs Assessment: post-procedure vital signs reviewed and stable Respiratory status: spontaneous breathing, nonlabored ventilation and respiratory function stable Cardiovascular status: blood pressure returned to baseline and stable Postop Assessment: no apparent nausea or vomiting Anesthetic complications: no   No notable events documented.   Last Vitals:  Vitals:   10/19/21 1015 10/19/21 1026  BP: 117/71 119/70  Pulse: 65 76  Resp: (!) 9 16  Temp:  36.9 C  SpO2: 100% 100%    Last Pain:  Vitals:   10/19/21 1026  TempSrc: Oral  PainSc: 0-No pain                 Prathik Aman C Xolani Degracia

## 2021-10-19 NOTE — Anesthesia Preprocedure Evaluation (Signed)
Anesthesia Evaluation  Patient identified by MRN, date of birth, ID band Patient awake    Reviewed: Allergy & Precautions, NPO status , Patient's Chart, lab work & pertinent test results  Airway Mallampati: II  TM Distance: >3 FB Neck ROM: Full    Dental  (+) Dental Advisory Given, Teeth Intact   Pulmonary neg pulmonary ROS,    Pulmonary exam normal breath sounds clear to auscultation       Cardiovascular negative cardio ROS Normal cardiovascular exam Rhythm:Regular Rate:Normal     Neuro/Psych PSYCHIATRIC DISORDERS Anxiety Depression negative neurological ROS     GI/Hepatic negative GI ROS, Neg liver ROS,   Endo/Other  negative endocrine ROS  Renal/GU negative Renal ROS  negative genitourinary   Musculoskeletal negative musculoskeletal ROS (+)   Abdominal   Peds negative pediatric ROS (+)  Hematology negative hematology ROS (+)   Anesthesia Other Findings   Reproductive/Obstetrics negative OB ROS                            Anesthesia Physical Anesthesia Plan  ASA: 2  Anesthesia Plan: General   Post-op Pain Management: Dilaudid IV   Induction: Intravenous  PONV Risk Score and Plan: 4 or greater and Ondansetron, Dexamethasone and Midazolam  Airway Management Planned: Oral ETT  Additional Equipment:   Intra-op Plan:   Post-operative Plan: Extubation in OR  Informed Consent: I have reviewed the patients History and Physical, chart, labs and discussed the procedure including the risks, benefits and alternatives for the proposed anesthesia with the patient or authorized representative who has indicated his/her understanding and acceptance.     Dental advisory given  Plan Discussed with: CRNA and Surgeon  Anesthesia Plan Comments:         Anesthesia Quick Evaluation

## 2021-10-19 NOTE — Transfer of Care (Signed)
Immediate Anesthesia Transfer of Care Note  Patient: Meredith Hall  Procedure(s) Performed: ABDOMINAL MYOMECTOMY (Abdomen)  Patient Location: PACU  Anesthesia Type:General  Level of Consciousness: oriented, drowsy, patient cooperative and responds to stimulation  Airway & Oxygen Therapy: Patient Spontanous Breathing and Patient connected to nasal cannula oxygen  Post-op Assessment: Report given to RN, Post -op Vital signs reviewed and stable, Patient moving all extremities X 4 and Patient able to stick tongue midline  Post vital signs: Reviewed  Last Vitals:  Vitals Value Taken Time  BP 110/67 10/19/21 0923  Temp 36.9 C 10/19/21 0923  Pulse 85 10/19/21 0925  Resp 19 10/19/21 0925  SpO2 100 % 10/19/21 0925  Vitals shown include unvalidated device data.  Last Pain:  Vitals:   10/19/21 0650  PainSc: 0-No pain         Complications: No notable events documented.

## 2021-10-20 DIAGNOSIS — D259 Leiomyoma of uterus, unspecified: Secondary | ICD-10-CM | POA: Diagnosis not present

## 2021-10-20 LAB — SURGICAL PATHOLOGY

## 2021-10-20 MED ORDER — ONDANSETRON HCL 4 MG PO TABS: 4.0000 mg | ORAL_TABLET | Freq: Four times a day (QID) | ORAL | 0 refills | Status: DC | PRN

## 2021-10-20 MED ORDER — IBUPROFEN 600 MG PO TABS: 600.0000 mg | ORAL_TABLET | Freq: Four times a day (QID) | ORAL | 0 refills | Status: DC

## 2021-10-20 MED ORDER — KETOROLAC TROMETHAMINE 10 MG PO TABS: 10.0000 mg | ORAL_TABLET | Freq: Three times a day (TID) | ORAL | 0 refills | Status: AC

## 2021-10-20 MED ORDER — DOCUSATE SODIUM 100 MG PO CAPS: 100.0000 mg | ORAL_CAPSULE | Freq: Two times a day (BID) | ORAL | 0 refills | Status: DC

## 2021-10-20 MED ORDER — GABAPENTIN 300 MG PO CAPS: 300.0000 mg | ORAL_CAPSULE | Freq: Three times a day (TID) | ORAL | 0 refills | Status: DC

## 2021-10-20 MED ORDER — ACETAMINOPHEN 325 MG PO TABS: 650.0000 mg | ORAL_TABLET | Freq: Four times a day (QID) | ORAL | Status: DC

## 2021-10-20 MED ORDER — OXYCODONE HCL 5 MG PO TABS: 5.0000 mg | ORAL_TABLET | Freq: Four times a day (QID) | ORAL | 0 refills | Status: DC | PRN

## 2021-10-20 NOTE — Progress Notes (Signed)
Ng Discharge Note  Admit Date:  10/19/2021 Discharge date: 10/20/2021   [name] to be D/C'd Home per MD order.  AVS completed. Patient/caregiver able to verbalize understanding.  Discharge Medication: Allergies as of 10/20/2021       Reactions   Shrimp [shellfish Allergy]    Red, itching rash        Medication List     STOP taking these medications    fluconazole 150 MG tablet Commonly known as: DIFLUCAN       TAKE these medications    acetaminophen 325 MG tablet Commonly known as: TYLENOL Take 2 tablets (650 mg total) by mouth every 6 (six) hours.   cetirizine 10 MG tablet Commonly known as: ZYRTEC Take 10 mg by mouth daily.   docusate sodium 100 MG capsule Commonly known as: COLACE Take 1 capsule (100 mg total) by mouth 2 (two) times daily.   gabapentin 300 MG capsule Commonly known as: NEURONTIN Take 1 capsule (300 mg total) by mouth 3 (three) times daily for 3 days.   ibuprofen 600 MG tablet Commonly known as: ADVIL Take 1 tablet (600 mg total) by mouth every 6 (six) hours. What changed:  medication strength how much to take when to take this reasons to take this   ketorolac 10 MG tablet Commonly known as: TORADOL Take 1 tablet (10 mg total) by mouth every 8 (eight) hours for 2 days.   mometasone 50 MCG/ACT nasal spray Commonly known as: NASONEX Place 2 sprays into the nose daily as needed (allergies).   ondansetron 4 MG tablet Commonly known as: ZOFRAN Take 1 tablet (4 mg total) by mouth every 6 (six) hours as needed for nausea.   oxyCODONE 5 MG immediate release tablet Commonly known as: Oxy IR/ROXICODONE Take 1 tablet (5 mg total) by mouth every 6 (six) hours as needed for moderate pain.   triamcinolone cream 0.1 % Commonly known as: KENALOG Apply to affected areas topically 2 (two) times daily. What changed:  when to take this reasons to take this   Vitamin D (Ergocalciferol) 1.25 MG (50000 UNIT) Caps capsule Commonly  known as: DRISDOL Take 1 capsule by mouth every 7 days.        Discharge Assessment: Vitals:   10/19/21 2122 10/20/21 0545  BP: (!) 103/58 105/60  Pulse: 80 73  Resp: 18 20  Temp: (!) 97.3 F (36.3 C) (!) 97.2 F (36.2 C)  SpO2: 99% 100%   Skin clean, dry and intact without evidence of skin break down, no evidence of skin tears noted. IV catheter discontinued intact. Site without signs and symptoms of complications - no redness or edema noted at insertion site, patient denies c/o pain - only slight tenderness at site.  Dressing with slight pressure applied.  D/c Instructions-Education: Discharge instructions given to patient/family with verbalized understanding. D/c education completed with patient/family including follow up instructions, medication list, d/c activities limitations if indicated, with other d/c instructions as indicated by MD - patient able to verbalize understanding, all questions fully answered. Patient instructed to return to ED, call 911, or call MD for any changes in condition.  Patient escorted via [place], and D/C home via private auto.  Tsosie Billing, LPN 98/01/1912 78:29 AM

## 2021-10-20 NOTE — Discharge Summary (Signed)
Physician Discharge Summary  Patient ID: Meredith Hall MRN: [id number] DOB/AGE: 03-28-1990 31 y.o.  Admit date: 10/19/2021 Discharge date: 10/20/2021  Admission Diagnoses: Uterine fibroid  Discharge Diagnoses:  Principal Problem:   Subserosal leiomyoma of uterus Active Problems:   Uterine fibroid   Discharged Condition: stable  Hospital Course: 31yo G1P0010 who presented for scheduled myomectomy.  See operative note regarding procedure.  She is meeting postop milestones appropriately- ambulating, passing flatus and tolerating po and had an uncomplicated postop course.  She was discharged home in stable condition on POD #1.  Consults: None  Significant Diagnostic Studies: labs:     Latest Ref Rng & Units 10/14/2021    9:00 AM 07/18/2021    8:07 AM 05/06/2021    4:28 PM  CBC  WBC 4.0 - 10.5 K/uL 5.7  6.0  9.3   Hemoglobin 12.0 - 15.0 g/dL 14.1  14.3  15.4   Hematocrit 36.0 - 46.0 % 41.2  42.3  43.6   Platelets 150 - 400 K/uL 234  234  231      Treatments: IV hydration, antibiotics: Ancef, analgesia: Toradol, tylenol, oxycodone, gabapentin, and surgery: abdominal myomectomy  Discharge Exam: Blood pressure 105/60, pulse 73, temperature (!) 97.2 F (36.2 C), resp. rate 20, height '5\' 4"'$  (1.626 m), weight 60.8 kg, SpO2 100 %. General appearance: alert, cooperative, and no distress Resp: clear to auscultation bilaterally Cardio: regular rate and rhythm GI: appropriately tender, +BS, no rebound, no guarding Extremities: SCDs in place, no calf tenderness bialterally Skin: warm and dry Incision: Clean and dry with honeycomb  Disposition: Discharge disposition: 01-Home or Self Care       Discharge Instructions     Discharge patient   Complete by: As directed    Once patient has voided   Discharge disposition: 01-Home or Self Care   Discharge patient date: 10/20/2021      Allergies as of 10/20/2021       Reactions   Shrimp [shellfish Allergy]    Red, itching  rash        Medication List     STOP taking these medications    fluconazole 150 MG tablet Commonly known as: DIFLUCAN       TAKE these medications    acetaminophen 325 MG tablet Commonly known as: TYLENOL Take 2 tablets (650 mg total) by mouth every 6 (six) hours.   cetirizine 10 MG tablet Commonly known as: ZYRTEC Take 10 mg by mouth daily.   docusate sodium 100 MG capsule Commonly known as: COLACE Take 1 capsule (100 mg total) by mouth 2 (two) times daily.   gabapentin 300 MG capsule Commonly known as: NEURONTIN Take 1 capsule (300 mg total) by mouth 3 (three) times daily for 3 days.   ibuprofen 600 MG tablet Commonly known as: ADVIL Take 1 tablet (600 mg total) by mouth every 6 (six) hours. What changed:  medication strength how much to take when to take this reasons to take this   ketorolac 10 MG tablet Commonly known as: TORADOL Take 1 tablet (10 mg total) by mouth every 8 (eight) hours for 2 days.   mometasone 50 MCG/ACT nasal spray Commonly known as: NASONEX Place 2 sprays into the nose daily as needed (allergies).   ondansetron 4 MG tablet Commonly known as: ZOFRAN Take 1 tablet (4 mg total) by mouth every 6 (six) hours as needed for nausea.   oxyCODONE 5 MG immediate release tablet Commonly known as: Oxy IR/ROXICODONE Take 1 tablet (5 mg  total) by mouth every 6 (six) hours as needed for moderate pain.   triamcinolone cream 0.1 % Commonly known as: KENALOG Apply to affected areas topically 2 (two) times daily. What changed:  when to take this reasons to take this   Vitamin D (Ergocalciferol) 1.25 MG (50000 UNIT) Caps capsule Commonly known as: DRISDOL Take 1 capsule by mouth every 7 days.        Follow-up Information     Janyth Pupa, DO Follow up.   Specialty: Obstetrics and Gynecology Why: Next week as scheduled Contact information: [place] [address] [phone]                  Signed: Annalee Genta 10/20/2021, 9:13 AM

## 2021-10-20 NOTE — Discharge Instructions (Signed)
Post Operative Pain Med Plan:  >Take gabapentin 300 mg three times per day, as prescribed for 3 days, try to space them evenly  >Take the oxycodone- 1 tablet, on a schedule, around the clock, every 6 hours(set your phone alarm) for the first 2 days, there may be times when you will need 2 tablets but taking them on a schedule will decrease this need  >You can also take Tylenol together with the oxycodone.  If the oxycodone seems "to strong" then just take Tylenol  >Oxycodone will cause constipation, please be sure to take a stool softener (Colace) twice daily while taking this pain medication and/or continue this medication until your bowel regimen returns to normal  >Take the Toradol every 8 hours for the first 2 days then the remainder to supplement the pain as needed  >After the toradol is gone switch to Ibuprofen '600mg'$  every 6 hours as needed  If possible try to take the Toradol or Ibuprofen with food to help avoid upsetting your stomach  >Use a heating pad as well as needed  >I have also sent a prescription for zofran (ondansetron) for nausea to take if needed over the first couple of days  >Be gentle with your diet the first few days, liquids and soft non spicy food, fruits are great  >Get up and move, no lifting or straining  HOME INSTRUCTIONS  Please note any unusual or excessive bleeding, pain, swelling. Mild dizziness or drowsiness are normal for about 24 hours after surgery.   Shower when comfortable  Restrictions: No driving for 24 hours or while taking pain medications.  Activity:  No heavy lifting (> 10 lbs), nothing in vagina (no tampons, douching, or intercourse) x 4 weeks; no tub baths for 4 weeks Vaginal spotting is expected but if your bleeding is heavy, period like,  please call the office   Incision: Remove the honeycomb dressing once you are home.  The bandaids will fall off when they are ready to; you may clean your incision with mild soap and water but do  not rub or scrub the incision site.  You may experience slight bloody drainage from your incision periodically.  This is normal.  If you experience a large amount of drainage or the incision opens, please call your physician who will likely direct you to the emergency department.  Diet:  You may return to your regular diet.  Do not eat large meals.  Eat small frequent meals throughout the day.  Continue to drink a good amount of water at least 6-8 glasses of water per day, hydration is very important for the healing process.  Pain Management: Follow instructions as above  Always take prescription pain medication with food.  Percocet may cause constipation, you may want to take a stool softener while taking this medication.  A prescription of colace has been sent in to take twice daily if needed while taking the oxycodone.  Be sure to drink plenty of fluids and increase your fiber to help with constipation.  Alcohol -- Avoid for 24 hours and while taking pain medications.  Nausea: Take sips of ginger ale or soda  Fever -- Call physician if temperature over 101 degrees  Follow up:  If you do not already have a follow up appointment scheduled, please call the office at [phone].  If you experience fever (a temperature greater than 100.4), pain unrelieved by pain medication, shortness of breath, swelling of a single leg, or any other symptoms which are concerning to  you please the office immediately.

## 2021-10-20 NOTE — Progress Notes (Signed)
Foley removed this am per order, 327m emptied this shift. Patient slept through the night only waking for scheduled medications. Patient has no complaints of increased pain. No changes to dressing, it is still clean and intact.

## 2021-10-24 ENCOUNTER — Encounter (HOSPITAL_COMMUNITY): Payer: Self-pay | Admitting: Obstetrics & Gynecology

## 2021-10-26 ENCOUNTER — Ambulatory Visit (INDEPENDENT_AMBULATORY_CARE_PROVIDER_SITE_OTHER): Payer: No Typology Code available for payment source | Admitting: Obstetrics & Gynecology

## 2021-10-26 ENCOUNTER — Encounter: Payer: Self-pay | Admitting: Obstetrics & Gynecology

## 2021-10-26 VITALS — BP 113/76 | HR 76 | Ht 65.0 in | Wt 130.4 lb

## 2021-10-26 DIAGNOSIS — Z4889 Encounter for other specified surgical aftercare: Secondary | ICD-10-CM

## 2021-10-26 NOTE — Progress Notes (Signed)
    PostOp Visit Note  Meredith Hall is a 31 y.o. G87P0010 female who presents for a postoperative visit. She is 1 week postop following a abdominal myomectomy completed on 10/4   Today she notes she is doing ok.  Pain controlled, no longer taking oxycodone.  Still feeling swollen and sore, but overall doing ok. Denies fever or chills.  Tolerating gen diet.  +Flatus, Regular BMs.  She did have a period and notes considerable improvement, no clots, bleeding less heavy and improvement of dysmenorrhea.  Review of Systems Pertinent items are noted in HPI.    Objective:  BP 113/76 (BP Location: Right Arm, Patient Position: Sitting, Cuff Size: Normal)   Pulse 76   Ht '5\' 5"'$  (1.651 m)   Wt 130 lb 6 oz (59.1 kg)   LMP 10/20/2021   BMI 21.70 kg/m    Physical Examination:  GENERAL ASSESSMENT: well developed and well nourished SKIN: warm and dry CHEST: normal air exchange, respiratory effort normal with no retractions HEART: regular rate and rhythm ABDOMEN: soft, non-distended, ecchymosis noted INCISION: C/D/I with dermabond- healing appropriately EXTREMITY: no edema no calf tenderness PSYCH: mood appropriate, normal affect       Assessment:    Postop visit  Plan:   -meeting postop milestones appropriately -reviewed incision care and recovery -f/u in 5 wks  Janyth Pupa, DO Attending [place], [place] for [employer], [place]

## 2021-10-28 ENCOUNTER — Ambulatory Visit (INDEPENDENT_AMBULATORY_CARE_PROVIDER_SITE_OTHER): Payer: No Typology Code available for payment source | Admitting: Family

## 2021-10-28 ENCOUNTER — Encounter: Payer: Self-pay | Admitting: Family

## 2021-10-28 ENCOUNTER — Encounter: Payer: Self-pay | Admitting: Obstetrics & Gynecology

## 2021-10-28 DIAGNOSIS — K5900 Constipation, unspecified: Secondary | ICD-10-CM

## 2021-10-28 NOTE — Patient Instructions (Signed)

## 2021-10-28 NOTE — Progress Notes (Signed)
Virtual Visit  Note Due to COVID-19 pandemic this visit was conducted virtually. This visit type was conducted due to national recommendations for restrictions regarding the COVID-19 Pandemic (e.g. social distancing, sheltering in place) in an effort to limit this patient's exposure and mitigate transmission in our community. All issues noted in this document were discussed and addressed.  A physical exam was not performed with this format.  I connected with Meredith Hall on 10/28/21 at 11:00 AM by telephone and verified that I am speaking with the correct person using two identifiers. Meredith Hall is currently located at home and no one is currently with her during visit. The provider, Evelina Dun, FNP is located in their office at time of visit.  I discussed the limitations, risks, security and privacy concerns of performing an evaluation and management service by telephone and the availability of in person appointments. I also discussed with the patient that there may be a patient responsible charge related to this service. The patient expressed understanding and agreed to proceed.  Meredith Hall, Meredith Hall are scheduled for a virtual visit with your provider today.    Just as we do with appointments in the office, we must obtain your consent to participate.  Your consent will be active for this visit and any virtual visit you may have with one of our providers in the next 365 days.    If you have a MyChart account, I can also send a copy of this consent to you electronically.  All virtual visits are billed to your insurance company just like a traditional visit in the office.  As this is a virtual visit, video technology does not allow for your provider to perform a traditional examination.  This may limit your provider's ability to fully assess your condition.  If your provider identifies any concerns that need to be evaluated in person or the need to arrange testing such as labs, EKG,  etc, we will make arrangements to do so.    Although advances in technology are sophisticated, we cannot ensure that it will always work on either your end or our end.  If the connection with a video visit is poor, we may have to switch to a telephone visit.  With either a video or telephone visit, we are not always able to ensure that we have a secure connection.   I need to obtain your verbal consent now.   Are you willing to proceed with your visit today?   Meredith Hall has provided verbal consent on 10/28/2021 for a virtual visit (video or telephone).   Evelina Dun, [place] 10/28/2021  11:00 AM    History and Present Illness:  PT calls the office today with constipation. She had a mymectomy on 10/19/21. Since has had two bowel movements. Her last one was 10/25/21, but continues to have a lot of pain.  She has used prune juice, stool softeners, and increased water with no success.   She reports she can fill the stool in her rectum and it hard. However, after the surgery reports it is hard to push.  Constipation This is a recurrent problem. The current episode started in the past 7 days. The problem has been gradually worsening since onset. Her stool frequency is 2 to 3 times per week. Associated symptoms include abdominal pain. She has tried diet changes and stool softeners for the symptoms. The treatment provided no relief.      Review of Systems  Gastrointestinal:  Positive for  abdominal pain and constipation.     Observations/Objective: No SOB or distress noted   Assessment and Plan: 1. Constipation, unspecified constipation type Discussed doing Enema  May need to do a manual disimpaction, discussed how to do this Continue stool softener Force fluids Continue to hold narcotic meds Follow up as needed     I discussed the assessment and treatment plan with the patient. The patient was provided an opportunity to ask questions and all were answered. The patient  agreed with the plan and demonstrated an understanding of the instructions.   The patient was advised to call back or seek an in-person evaluation if the symptoms worsen or if the condition fails to improve as anticipated.  The above assessment and management plan was discussed with the patient. The patient verbalized understanding of and has agreed to the management plan. Patient is aware to call the clinic if symptoms persist or worsen. Patient is aware when to return to the clinic for a follow-up visit. Patient educated on when it is appropriate to go to the emergency department.   Time call ended:  11:12 AM   I provided 12 minutes of  non face-to-face time during this encounter.    Evelina Dun, FNP

## 2021-11-23 ENCOUNTER — Encounter: Payer: Self-pay | Admitting: Obstetrics & Gynecology

## 2021-11-30 ENCOUNTER — Ambulatory Visit (INDEPENDENT_AMBULATORY_CARE_PROVIDER_SITE_OTHER): Payer: No Typology Code available for payment source | Admitting: Obstetrics & Gynecology

## 2021-11-30 ENCOUNTER — Encounter: Payer: Self-pay | Admitting: Obstetrics & Gynecology

## 2021-11-30 VITALS — BP 124/86 | HR 93 | Ht 64.0 in | Wt 127.6 lb

## 2021-11-30 DIAGNOSIS — Z4889 Encounter for other specified surgical aftercare: Secondary | ICD-10-CM

## 2021-11-30 NOTE — Progress Notes (Signed)
    PostOp Visit Note  Meredith Hall is a 31 y.o. [id number] female who presents for a postoperative visit. She is 6 weeks postop following a myomectomy completed on 10/4   Today she notes that she is doing ok. She still has some bloating and discomfort. Denies fever or chills.  Tolerating gen diet.  +Flatus, Regular BMs.  Pain is well controlled with tylenol on occasion. She did have some constipation, but doing better now.  Overall doing well and reports no acute complaints   Review of Systems Pertinent items are noted in HPI.    Objective:  BP 124/86 (BP Location: Right Arm, Patient Position: Sitting, Cuff Size: Normal)   Pulse 93   Ht '5\' 4"'$  (1.626 m)   Wt 127 lb 9.6 oz (57.9 kg)   LMP 11/19/2021 (Approximate)   BMI 21.90 kg/m    Physical Examination:  GENERAL ASSESSMENT: well developed and well nourished SKIN: warm and dry CHEST: normal air exchange, respiratory effort normal with no retractions HEART: regular rate and rhythm ABDOMEN: soft, non-distended, no rebound, no guarding INCISION: well healed- C/D/I GU:  Vulva:  normal appearing vulva with no masses, tenderness or lesions Vagina:  normal mucosa, no discharge Cervix:  no cervical motion tenderness and no lesions Uterus:  non-tender, mobile Adnexa: ovaries:present,  normal adnexa in size, nontender and no masses  EXTREMITY: no edema, no calf tenderness bilaterally PSYCH: mood appropriate, normal affect       Assessment:    Postop visit 6wk myomectomy   Plan:   Meeting milestones appropriately Released back to regular activity Follow up prn or 57yrfor annual Advised waiting at least 3-690moprior to pregnancy  JeJanyth PupaDO Attending ObAlbert LeaFaPine Groveor WoDean [facility] CompanyCoBuff[place]

## 2021-12-12 ENCOUNTER — Encounter: Payer: Self-pay | Admitting: Obstetrics & Gynecology

## 2021-12-12 ENCOUNTER — Encounter: Payer: Self-pay | Admitting: *Deleted

## 2021-12-15 ENCOUNTER — Other Ambulatory Visit: Payer: Self-pay | Admitting: Family

## 2021-12-15 DIAGNOSIS — R599 Enlarged lymph nodes, unspecified: Secondary | ICD-10-CM

## 2021-12-15 DIAGNOSIS — L309 Dermatitis, unspecified: Secondary | ICD-10-CM

## 2021-12-15 NOTE — Progress Notes (Signed)
Pt complaining of worsening eczema and requesting referral to dermatologists.   Also, complaining of enlarged lymph node on left side of neck. [place] referral pending.

## 2021-12-30 ENCOUNTER — Ambulatory Visit
Admission: RE | Admit: 2021-12-30 | Discharge: 2021-12-30 | Disposition: A | Discharge status: Home / Self Care | Payer: No Typology Code available for payment source | Source: Ambulatory Visit | Attending: Family | Admitting: Family

## 2021-12-30 DIAGNOSIS — R599 Enlarged lymph nodes, unspecified: Secondary | ICD-10-CM

## 2022-03-11 ENCOUNTER — Other Ambulatory Visit (HOSPITAL_COMMUNITY): Payer: Self-pay

## 2022-03-14 ENCOUNTER — Other Ambulatory Visit (HOSPITAL_COMMUNITY): Payer: Self-pay

## 2022-03-14 ENCOUNTER — Encounter (HOSPITAL_COMMUNITY): Payer: Self-pay

## 2022-03-15 ENCOUNTER — Other Ambulatory Visit: Payer: Self-pay

## 2022-03-16 ENCOUNTER — Other Ambulatory Visit: Payer: Self-pay

## 2022-03-24 ENCOUNTER — Encounter: Payer: Self-pay | Admitting: Family

## 2022-03-24 ENCOUNTER — Ambulatory Visit (INDEPENDENT_AMBULATORY_CARE_PROVIDER_SITE_OTHER): Payer: 59 | Admitting: Family

## 2022-03-24 ENCOUNTER — Other Ambulatory Visit: Payer: Self-pay

## 2022-03-24 ENCOUNTER — Other Ambulatory Visit: Payer: Self-pay | Admitting: Family Medicine

## 2022-03-24 ENCOUNTER — Other Ambulatory Visit (HOSPITAL_COMMUNITY): Payer: Self-pay

## 2022-03-24 ENCOUNTER — Other Ambulatory Visit: Payer: Self-pay | Admitting: Family

## 2022-03-24 VITALS — BP 121/80 | HR 77 | Temp 97.7°F | Ht 60.0 in | Wt 134.0 lb

## 2022-03-24 DIAGNOSIS — E559 Vitamin D deficiency, unspecified: Secondary | ICD-10-CM

## 2022-03-24 DIAGNOSIS — J301 Allergic rhinitis due to pollen: Secondary | ICD-10-CM

## 2022-03-24 DIAGNOSIS — F320 Major depressive disorder, single episode, mild: Secondary | ICD-10-CM

## 2022-03-24 DIAGNOSIS — Z0000 Encounter for general adult medical examination without abnormal findings: Secondary | ICD-10-CM

## 2022-03-24 DIAGNOSIS — Z0001 Encounter for general adult medical examination with abnormal findings: Secondary | ICD-10-CM

## 2022-03-24 DIAGNOSIS — F411 Generalized anxiety disorder: Secondary | ICD-10-CM

## 2022-03-24 MED ORDER — CETIRIZINE HCL 10 MG PO TABS
10.0000 mg | ORAL_TABLET | Freq: Every day | ORAL | 4 refills | Status: AC
  Filled 2022-03-24: qty 90, 90d supply, fill #0

## 2022-03-24 MED ORDER — VITAMIN D (ERGOCALCIFEROL) 1.25 MG (50000 UNIT) PO CAPS
50000.0000 [IU] | ORAL_CAPSULE | ORAL | 3 refills | Status: DC
  Filled 2022-03-24 (×2): qty 12, 84d supply, fill #0

## 2022-03-24 NOTE — Progress Notes (Signed)
Subjective:    Patient ID: Meredith Hall, female    DOB: [birth year], 32 y.o.   MRN: [id number]  No chief complaint on file.  Pt presents to the office today for CPE without pap. She is followed by GYN.  She is currently taking zyrtec 10 mg daily.  Anxiety Presents for follow-up visit. Patient reports no excessive worry, irritability or nervous/anxious behavior. Symptoms occur occasionally.    Depression        This is a chronic problem.  The current episode started more than 1 year ago.   Associated symptoms include no helplessness, no hopelessness and not sad.  Past treatments include nothing.  Past medical history includes anxiety.       Review of Systems  Constitutional:  Negative for irritability.  Psychiatric/Behavioral:  Positive for depression. The patient is not nervous/anxious.   All other systems reviewed and are negative.  Family History  Problem Relation Age of Onset   Diabetes Father    Thyroid disease Mother    Social History   Socioeconomic History   Marital status: Significant Other    Spouse name: Not on file   Number of children: Not on file   Years of education: Not on file   Highest education level: Not on file  Occupational History   Not on file  Tobacco Use   Smoking status: Never   Smokeless tobacco: Never  Vaping Use   Vaping Use: Never used  Substance and Sexual Activity   Alcohol use: No   Drug use: No   Sexual activity: Not Currently    Birth control/protection: None, Condom  Other Topics Concern   Not on file  Social History Narrative   Not on file   Social Determinants of Health   Financial Resource Strain: Not on file  Food Insecurity: No Food Insecurity (10/19/2021)   Hunger Vital Sign    Worried About Running Out of Food in the Last Year: Never true    Ran Out of Food in the Last Year: Never true  Transportation Needs: No Transportation Needs (10/19/2021)   PRAPARE - Hydrologist (Medical):  No    Lack of Transportation (Non-Medical): No  Physical Activity: Not on file  Stress: Not on file  Social Connections: Not on file       Objective:   Physical Exam Vitals reviewed.  Constitutional:      General: She is not in acute distress.    Appearance: She is well-developed.  HENT:     Head: Normocephalic and atraumatic.     Right Ear: Tympanic membrane normal.     Left Ear: Tympanic membrane normal.  Eyes:     Pupils: Pupils are equal, round, and reactive to light.  Neck:     Thyroid: No thyromegaly.  Cardiovascular:     Rate and Rhythm: Normal rate and regular rhythm.     Heart sounds: Normal heart sounds. No murmur heard. Pulmonary:     Effort: Pulmonary effort is normal. No respiratory distress.     Breath sounds: Normal breath sounds. No wheezing.  Abdominal:     General: Bowel sounds are normal. There is no distension.     Palpations: Abdomen is soft.     Tenderness: There is no abdominal tenderness.  Musculoskeletal:        General: No tenderness. Normal range of motion.     Cervical back: Normal range of motion and neck supple.  Skin:  General: Skin is warm and dry.  Neurological:     Mental Status: She is alert and oriented to person, place, and time.     Cranial Nerves: No cranial nerve deficit.     Deep Tendon Reflexes: Reflexes are normal and symmetric.  Psychiatric:        Behavior: Behavior normal.        Thought Content: Thought content normal.        Judgment: Judgment normal.     BP 121/80   Pulse 77   Temp 97.7 F (36.5 C) (Oral)   Ht 5' (1.524 m)   Wt 134 lb (60.8 kg)   HC 4" (10.2 cm)   BMI 26.17 kg/m        Assessment & Plan:  Meredith Hall comes in today with chief complaint of No chief complaint on file.   Diagnosis and orders addressed:  1. Annual physical exam - Vitamin B12 - Thyroid Panel With TSH - Lipid panel - CMP14+EGFR - CBC with Differential/Platelet  2. Vitamin D deficiency - VITAMIN D 25  Hydroxy (Vit-D Deficiency, Fractures) - Vitamin D, Ergocalciferol, (DRISDOL) 1.25 MG (50000 UNIT) CAPS capsule; Take 1 capsule by mouth every 7 days.  Dispense: 12 capsule; Refill: 3  3. GAD (generalized anxiety disorder)  4. Depression, major, single episode, mild ([place])  5. Allergic rhinitis due to pollen, unspecified seasonality Continue zyrtec - cetirizine (ZYRTEC) 10 MG tablet; Take 1 tablet (10 mg total) by mouth daily.  Dispense: 90 tablet; Refill: 4   Labs pending Health Maintenance reviewed Diet and exercise encouraged  Follow up plan: 1 year    Evelina Dun, FNP

## 2022-03-24 NOTE — Patient Instructions (Signed)

## 2022-03-26 LAB — CBC WITH DIFFERENTIAL/PLATELET
Basophils Absolute: 0.1 10*3/uL (ref 0.0–0.2)
Basos: 1 %
EOS (ABSOLUTE): 0.2 10*3/uL (ref 0.0–0.4)
Eos: 3 %
Hematocrit: 37.1 % (ref 34.0–46.6)
Hemoglobin: 13 g/dL (ref 11.1–15.9)
Immature Grans (Abs): 0 10*3/uL (ref 0.0–0.1)
Immature Granulocytes: 0 %
Lymphocytes Absolute: 1.5 10*3/uL (ref 0.7–3.1)
Lymphs: 25 %
MCH: 29.9 pg (ref 26.6–33.0)
MCHC: 35 g/dL (ref 31.5–35.7)
MCV: 85 fL (ref 79–97)
Monocytes Absolute: 0.3 10*3/uL (ref 0.1–0.9)
Monocytes: 6 %
Neutrophils Absolute: 3.9 10*3/uL (ref 1.4–7.0)
Neutrophils: 65 %
Platelets: 197 10*3/uL (ref 150–450)
RBC: 4.35 x10E6/uL (ref 3.77–5.28)
RDW: 11.8 % (ref 11.7–15.4)
WBC: 6 10*3/uL (ref 3.4–10.8)

## 2022-03-26 LAB — LIPID PANEL
Chol/HDL Ratio: 3.1 ratio (ref 0.0–4.4)
Cholesterol, Total: 130 mg/dL (ref 100–199)
HDL: 42 mg/dL (ref >39)
LDL Chol Calc (NIH): 75 mg/dL (ref 0–99)
Triglycerides: 58 mg/dL (ref 0–149)
VLDL Cholesterol Cal: 13 mg/dL (ref 5–40)

## 2022-03-26 LAB — CMP14+EGFR
ALT: 12 IU/L (ref 0–32)
AST: 13 IU/L (ref 0–40)
Albumin/Globulin Ratio: 1.8 (ref 1.2–2.2)
Albumin: 4.2 g/dL (ref 3.9–4.9)
Alkaline Phosphatase: 71 IU/L (ref 44–121)
BUN/Creatinine Ratio: 24 — ABNORMAL HIGH (ref 9–23)
BUN: 11 mg/dL (ref 6–20)
Bilirubin Total: 0.5 mg/dL (ref 0.0–1.2)
CO2: 20 mmol/L (ref 20–29)
Calcium: 9.4 mg/dL (ref 8.7–10.2)
Chloride: 103 mmol/L (ref 96–106)
Creatinine, Ser: 0.45 mg/dL — ABNORMAL LOW (ref 0.57–1.00)
Globulin, Total: 2.3 g/dL (ref 1.5–4.5)
Glucose: 97 mg/dL (ref 70–99)
Potassium: 3.8 mmol/L (ref 3.5–5.2)
Sodium: 137 mmol/L (ref 134–144)
Total Protein: 6.5 g/dL (ref 6.0–8.5)
eGFR: 131 mL/min/{1.73_m2} (ref >59)

## 2022-03-26 LAB — THYROID PANEL WITH TSH
Free Thyroxine Index: 2.5 (ref 1.2–4.9)
T3 Uptake Ratio: 29 % (ref 24–39)
T4, Total: 8.7 ug/dL (ref 4.5–12.0)
TSH: 1.72 u[IU]/mL (ref 0.450–4.500)

## 2022-03-26 LAB — VITAMIN D 25 HYDROXY (VIT D DEFICIENCY, FRACTURES): Vit D, 25-Hydroxy: 17.1 ng/mL — ABNORMAL LOW (ref 30.0–100.0)

## 2022-03-26 LAB — VITAMIN B12: Vitamin B-12: 497 pg/mL (ref 232–1245)

## 2022-03-27 ENCOUNTER — Other Ambulatory Visit: Payer: Self-pay

## 2022-03-27 ENCOUNTER — Other Ambulatory Visit (HOSPITAL_COMMUNITY): Payer: Self-pay

## 2022-03-27 ENCOUNTER — Other Ambulatory Visit: Payer: Self-pay | Admitting: Family

## 2022-03-27 MED ORDER — VITAMIN D (ERGOCALCIFEROL) 1.25 MG (50000 UNIT) PO CAPS
50000.0000 [IU] | ORAL_CAPSULE | ORAL | 3 refills | Status: DC
  Filled 2022-03-27 – 2022-09-25 (×2): qty 12, 84d supply, fill #0
  Filled 2023-01-13: qty 12, 84d supply, fill #1

## 2022-04-03 ENCOUNTER — Encounter: Payer: Self-pay | Admitting: Nurse Practitioner

## 2022-04-03 ENCOUNTER — Ambulatory Visit (INDEPENDENT_AMBULATORY_CARE_PROVIDER_SITE_OTHER): Payer: 59 | Admitting: Nurse Practitioner

## 2022-04-03 ENCOUNTER — Other Ambulatory Visit: Payer: Self-pay | Admitting: Family Medicine

## 2022-04-03 VITALS — BP 133/80 | HR 73 | Temp 97.5°F | Resp 20 | Ht 60.0 in | Wt 134.0 lb

## 2022-04-03 DIAGNOSIS — R399 Unspecified symptoms and signs involving the genitourinary system: Secondary | ICD-10-CM | POA: Diagnosis not present

## 2022-04-03 DIAGNOSIS — N3000 Acute cystitis without hematuria: Secondary | ICD-10-CM

## 2022-04-03 LAB — URINALYSIS, COMPLETE
Bilirubin, UA: NEGATIVE
Glucose, UA: NEGATIVE
Ketones, UA: NEGATIVE
Nitrite, UA: NEGATIVE
Protein,UA: NEGATIVE
RBC, UA: NEGATIVE
Specific Gravity, UA: 1.015 (ref 1.005–1.030)
Urobilinogen, Ur: 0.2 mg/dL (ref 0.2–1.0)
pH, UA: 5.5 (ref 5.0–7.5)

## 2022-04-03 LAB — MICROSCOPIC EXAMINATION
RBC, Urine: NONE SEEN /hpf (ref 0–2)
Renal Epithel, UA: NONE SEEN /hpf

## 2022-04-03 MED ORDER — NITROFURANTOIN MONOHYD MACRO 100 MG PO CAPS: 100.0000 mg | ORAL_CAPSULE | Freq: Two times a day (BID) | ORAL | 0 refills | Status: DC

## 2022-04-03 NOTE — Progress Notes (Signed)
Subjective:    Patient ID: Meredith Hall, female    DOB: 04-01-1990, 32 y.o.   MRN: [id number]   Chief Complaint: Dysuria  Dysuria  This is a new problem. The current episode started yesterday. The problem occurs every urination. The problem has been waxing and waning. The quality of the pain is described as burning. The pain is at a severity of 6/10. The pain is moderate. There has been no fever. She is Sexually active. There is No history of pyelonephritis. Associated symptoms include flank pain, frequency, hesitancy and urgency. She has tried nothing for the symptoms. The treatment provided mild relief.    Patient Active Problem List   Diagnosis Date Noted   Subserosal leiomyoma of uterus 10/19/2021   Uterine fibroid 05/12/2021   GAD (generalized anxiety disorder) 06/04/2020   Depression, major, single episode, mild ([facility]) 06/04/2020   Irritable bowel syndrome with constipation 04/29/2019   Vitamin D deficiency 07/18/2018   Cystic acne 09/30/2015   Pain in joint, lower leg 10/15/2012   Eczema 05/21/2012       Review of Systems  Constitutional:  Negative for diaphoresis.  Eyes:  Negative for pain.  Respiratory:  Negative for shortness of breath.   Cardiovascular:  Negative for chest pain, palpitations and leg swelling.  Gastrointestinal:  Negative for abdominal pain.  Endocrine: Negative for polydipsia.  Genitourinary:  Positive for dysuria, flank pain, frequency, hesitancy and urgency.  Skin:  Negative for rash.  Neurological:  Negative for dizziness, weakness and headaches.  Hematological:  Does not bruise/bleed easily.  All other systems reviewed and are negative.      Objective:   Physical Exam Constitutional:      Appearance: Normal appearance.  Cardiovascular:     Rate and Rhythm: Normal rate and regular rhythm.  Abdominal:     Tenderness: There is no abdominal tenderness. There is no right CVA tenderness or left CVA tenderness.  Skin:    General:  Skin is warm.  Neurological:     General: No focal deficit present.     Mental Status: She is alert and oriented to person, place, and time.  Psychiatric:        Mood and Affect: Mood normal.        Behavior: Behavior normal.     BP 133/80   Pulse 73   Temp (!) 97.5 F (36.4 C) (Temporal)   Resp 20   Ht 5' (1.524 m)   Wt 134 lb (60.8 kg)   BMI 26.17 kg/m        Assessment & Plan:   Meredith Hall in today with chief complaint of No chief complaint on file.   1. UTI symptoms  - Urinalysis, Complete - Urine Culture  2. Acute cystitis without hematuria Take medication as prescribe Cotton underwear Take shower not bath Cranberry juice, yogurt Force fluids AZO over the counter X2 days Culture pending RTO prn  Meds ordered this encounter  Medications   nitrofurantoin, macrocrystal-monohydrate, (MACROBID) 100 MG capsule    Sig: Take 1 capsule (100 mg total) by mouth 2 (two) times daily. 1 po BId    Dispense:  20 capsule    Refill:  0    Order Specific Question:   Supervising Provider    Answer:   Caryl Pina A [id number]       The above assessment and management plan was discussed with the patient. The patient verbalized understanding of and has agreed to the management plan. Patient is aware  to call the clinic if symptoms persist or worsen. Patient is aware when to return to the clinic for a follow-up visit. Patient educated on when it is appropriate to go to the emergency department.   Mary-Margaret Hassell Done, FNP

## 2022-04-03 NOTE — Patient Instructions (Signed)
Take medication as prescribe Cotton underwear Take shower not bath Cranberry juice, yogurt Force fluids AZO over the counter X2 days Culture pending RTO prn  

## 2022-04-04 LAB — URINE CULTURE: Organism ID, Bacteria: NO GROWTH

## 2022-04-10 ENCOUNTER — Other Ambulatory Visit: Payer: Self-pay | Admitting: Family Medicine

## 2022-04-10 ENCOUNTER — Other Ambulatory Visit: Payer: 59

## 2022-04-10 DIAGNOSIS — R399 Unspecified symptoms and signs involving the genitourinary system: Secondary | ICD-10-CM | POA: Diagnosis not present

## 2022-04-10 LAB — URINALYSIS, COMPLETE
Bilirubin, UA: NEGATIVE
Glucose, UA: NEGATIVE
Ketones, UA: NEGATIVE
Leukocytes,UA: NEGATIVE
Nitrite, UA: NEGATIVE
Protein,UA: NEGATIVE
RBC, UA: NEGATIVE
Specific Gravity, UA: 1.02 (ref 1.005–1.030)
Urobilinogen, Ur: 0.2 mg/dL (ref 0.2–1.0)
pH, UA: 7 (ref 5.0–7.5)

## 2022-04-10 LAB — MICROSCOPIC EXAMINATION
Bacteria, UA: NONE SEEN
RBC, Urine: NONE SEEN /hpf (ref 0–2)
Renal Epithel, UA: NONE SEEN /hpf

## 2022-04-10 MED ORDER — FLUCONAZOLE 150 MG PO TABS: 150.0000 mg | ORAL_TABLET | Freq: Once | ORAL | 0 refills | Status: AC

## 2022-04-11 ENCOUNTER — Ambulatory Visit (INDEPENDENT_AMBULATORY_CARE_PROVIDER_SITE_OTHER): Payer: 59 | Admitting: Family

## 2022-04-11 VITALS — BP 132/82 | HR 78 | Temp 96.8°F | Ht 60.0 in | Wt 134.0 lb

## 2022-04-11 DIAGNOSIS — H109 Unspecified conjunctivitis: Secondary | ICD-10-CM

## 2022-04-11 LAB — URINE CULTURE: Organism ID, Bacteria: NO GROWTH

## 2022-04-11 MED ORDER — POLYMYXIN B-TRIMETHOPRIM 10000-0.1 UNIT/ML-% OP SOLN: 1.0000 [drp] | Freq: Four times a day (QID) | OPHTHALMIC | 0 refills | Status: DC

## 2022-04-11 NOTE — Progress Notes (Signed)
   Subjective:    Patient ID: Meredith Hall, female    DOB: Jun 03, 1990, 32 y.o.   MRN: [id number]  Eye Problem  The left eye is affected. This is a new problem. The current episode started yesterday. The problem has been gradually worsening. The pain is at a severity of 1/10. The pain is mild. Associated symptoms include an eye discharge, eye redness and a foreign body sensation. Pertinent negatives include no itching or photophobia.      Review of Systems  Eyes:  Positive for discharge and redness. Negative for photophobia and itching.  All other systems reviewed and are negative.      Objective:   Physical Exam Vitals reviewed.  Constitutional:      General: She is not in acute distress.    Appearance: She is well-developed.  HENT:     Head: Normocephalic and atraumatic.  Eyes:     General:        Left eye: Discharge present.    Conjunctiva/sclera:     Left eye: Exudate present.     Pupils: Pupils are equal, round, and reactive to light.     Comments: Left eye erythemas and swelling  Neck:     Thyroid: No thyromegaly.  Cardiovascular:     Rate and Rhythm: Normal rate and regular rhythm.     Heart sounds: Normal heart sounds. No murmur heard. Pulmonary:     Effort: Pulmonary effort is normal. No respiratory distress.     Breath sounds: Normal breath sounds. No wheezing.  Abdominal:     General: Bowel sounds are normal. There is no distension.     Palpations: Abdomen is soft.     Tenderness: There is no abdominal tenderness.  Musculoskeletal:        General: No tenderness. Normal range of motion.     Cervical back: Normal range of motion and neck supple.  Skin:    General: Skin is warm and dry.  Neurological:     Mental Status: She is alert and oriented to person, place, and time.     Cranial Nerves: No cranial nerve deficit.     Deep Tendon Reflexes: Reflexes are normal and symmetric.  Psychiatric:        Behavior: Behavior normal.        Thought Content:  Thought content normal.        Judgment: Judgment normal.     BP 132/82   Pulse 78   Temp (!) 96.8 F (36 C) (Temporal)   Ht 5' (1.524 m)   Wt 134 lb (60.8 kg)   BMI 26.17 kg/m        Assessment & Plan:   Meredith Hall comes in today with chief complaint of Eye Problem   Diagnosis and orders addressed:  1. Bacterial conjunctivitis Cool compresses  Good hand hygiene  Avoid rubbing  Follow up if symptoms worsen or do not improve - trimethoprim-polymyxin b (POLYTRIM) ophthalmic solution; Place 1 drop into the left eye every 6 (six) hours.  Dispense: 10 mL; Refill: 0  Evelina Dun, FNP

## 2022-04-11 NOTE — Patient Instructions (Signed)
Bacterial Conjunctivitis, Adult Bacterial conjunctivitis is an infection of the clear membrane that covers the white part of the eye and the inner surface of the eyelid (conjunctiva). When the blood vessels in the conjunctiva become inflamed, the eye becomes red or pink. The eye often feels irritated or itchy. Bacterial conjunctivitis spreads easily from person to person (is contagious). It also spreads easily from one eye to the other eye. What are the causes? This condition is caused by bacteria. You may get the infection if you come into close contact with: A person who is infected with the bacteria. Items that are contaminated with the bacteria, such as a face towel, contact lens solution, or eye makeup. What increases the risk? You are more likely to develop this condition if: You are exposed to other people who have the infection. You wear contact lenses. You have a sinus infection. You have had a recent eye injury or surgery. You have a weak body defense system (immune system). You have a medical condition that causes dry eyes. What are the signs or symptoms? Symptoms of this condition include: Thick, yellowish discharge from the eye. This may turn into a crust on the eyelid overnight and cause your eyelids to stick together. Tearing or watery eyes. Itchy eyes. Burning feeling in your eyes. Eye redness. Swollen eyelids. Blurred vision. How is this diagnosed? This condition is diagnosed based on your symptoms and medical history. Your health care provider may also take a sample of discharge from your eye to find the cause of your infection. How is this treated? This condition may be treated with: Antibiotic eye drops or ointment to clear the infection more quickly and prevent the spread of infection to others. Antibiotic medicines taken by mouth (orally) to treat infections that do not respond to drops or ointments or that last longer than 10 days. Cool, wet cloths (cool  compresses) placed on the eyes. Artificial tears applied 2-6 times a day. Follow these instructions at home: Medicines Take or apply your antibiotic medicine as told by your health care provider. Do not stop using the antibiotic, even if your condition improves, unless directed by your health care provider. Take or apply over-the-counter and prescription medicines only as told by your health care provider. Be very careful to avoid touching the edge of your eyelid with the eye-drop bottle or the ointment tube when you apply medicines to the affected eye. This will keep you from spreading the infection to your other eye or to other people. Managing discomfort Gently wipe away any drainage from your eye with a warm, wet washcloth or a cotton ball. Apply a clean, cool compress to your eye for 10-20 minutes, 3-4 times a day. General instructions Do not wear contact lenses until the inflammation is gone and your health care provider says it is safe to wear them again. Ask your health care provider how to sterilize or replace your contact lenses before you use them again. Wear glasses until you can resume wearing contact lenses. Avoid wearing eye makeup until the inflammation is gone. Throw away any old eye cosmetics that may be contaminated. Change or wash your pillowcase every day. Do not share towels or washcloths. This may spread the infection. Wash your hands often with soap and water for at least 20 seconds and especially before touching your face or eyes. Use paper towels to dry your hands. Avoid touching or rubbing your eyes. Do not drive or use heavy machinery if your vision is blurred. Contact   a health care provider if: You have a fever. Your symptoms do not get better after 10 days. Get help right away if: You have a fever and your symptoms suddenly get worse. You have severe pain when you move your eye. You have facial pain, redness, or swelling. You have a sudden loss of  vision. Summary Bacterial conjunctivitis is an infection of the clear membrane that covers the white part of the eye and the inner surface of the eyelid (conjunctiva). Bacterial conjunctivitis spreads easily from eye to eye and from person to person (is contagious). Wash your hands often with soap and water for at least 20 seconds and especially before touching your face or eyes. Use paper towels to dry your hands. Take or apply your antibiotic medicine as told by your health care provider. Do not stop using the antibiotic even if your condition improves. Contact a health care provider if you have a fever or if your symptoms do not get better after 10 days. Get help right away if you have a sudden loss of vision. This information is not intended to replace advice given to you by your health care provider. Make sure you discuss any questions you have with your health care provider. Document Revised: 04/14/2020 Document Reviewed: 04/14/2020 Elsevier Patient Education  2023 [employer] Inc[employer]  

## 2022-04-28 ENCOUNTER — Ambulatory Visit (INDEPENDENT_AMBULATORY_CARE_PROVIDER_SITE_OTHER): Payer: 59 | Admitting: Family

## 2022-04-28 ENCOUNTER — Encounter: Payer: Self-pay | Admitting: Family

## 2022-04-28 VITALS — BP 111/75 | HR 82 | Temp 97.0°F | Ht 65.0 in | Wt 134.0 lb

## 2022-04-28 DIAGNOSIS — R399 Unspecified symptoms and signs involving the genitourinary system: Secondary | ICD-10-CM | POA: Diagnosis not present

## 2022-04-28 DIAGNOSIS — R109 Unspecified abdominal pain: Secondary | ICD-10-CM

## 2022-04-28 DIAGNOSIS — R1032 Left lower quadrant pain: Secondary | ICD-10-CM

## 2022-04-28 DIAGNOSIS — R1012 Left upper quadrant pain: Secondary | ICD-10-CM

## 2022-04-28 LAB — MICROSCOPIC EXAMINATION
Bacteria, UA: NONE SEEN
RBC, Urine: NONE SEEN /hpf (ref 0–2)
Renal Epithel, UA: NONE SEEN /hpf
WBC, UA: NONE SEEN /hpf (ref 0–5)

## 2022-04-28 LAB — URINALYSIS, COMPLETE
Bilirubin, UA: NEGATIVE
Glucose, UA: NEGATIVE
Leukocytes,UA: NEGATIVE
Nitrite, UA: NEGATIVE
Protein,UA: NEGATIVE
RBC, UA: NEGATIVE
Specific Gravity, UA: 1.02 (ref 1.005–1.030)
Urobilinogen, Ur: 0.2 mg/dL (ref 0.2–1.0)
pH, UA: 6 (ref 5.0–7.5)

## 2022-04-28 NOTE — Progress Notes (Signed)
Subjective:    Patient ID: Meredith Hall, female    DOB: March 22, 1990, 32 y.o.   MRN: [id number]  Chief Complaint  Patient presents with   Urinary Tract Infection   Pt presents to the office today with bilateral flank pain that started three weeks ago after a UTI. She completed macrobid and continued with flank pain. She had a negative culture on 04/10/22.  Flank Pain This is a new problem. The current episode started 1 to 4 weeks ago. The problem occurs intermittently. The problem has been waxing and waning since onset. The quality of the pain is described as aching. The pain is at a severity of 4/10. The pain is mild. Associated symptoms include abdominal pain. Pertinent negatives include no dysuria, fever or headaches.  Abdominal Pain This is a new problem. The current episode started 1 to 4 weeks ago. The pain is located in the epigastric region and LLQ. The pain is mild. The quality of the pain is burning. Associated symptoms include frequency and nausea. Pertinent negatives include no belching, constipation, diarrhea, dysuria, fever, headaches, hematochezia, hematuria or vomiting. The treatment provided mild relief.      Review of Systems  Constitutional:  Negative for fever.  Gastrointestinal:  Positive for abdominal pain and nausea. Negative for constipation, diarrhea, hematochezia and vomiting.  Genitourinary:  Positive for flank pain and frequency. Negative for dysuria and hematuria.  Neurological:  Negative for headaches.  All other systems reviewed and are negative.      Objective:   Physical Exam Vitals reviewed.  Constitutional:      General: She is not in acute distress.    Appearance: She is well-developed.  HENT:     Head: Normocephalic and atraumatic.  Eyes:     Pupils: Pupils are equal, round, and reactive to light.  Neck:     Thyroid: No thyromegaly.  Cardiovascular:     Rate and Rhythm: Normal rate and regular rhythm.     Heart sounds: Normal heart  sounds. No murmur heard. Pulmonary:     Effort: Pulmonary effort is normal. No respiratory distress.     Breath sounds: Normal breath sounds. No wheezing.  Abdominal:     General: Bowel sounds are normal. There is no distension.     Palpations: Abdomen is soft.     Tenderness: There is abdominal tenderness (LLQ). Left CVA tenderness: mild left flank.  Musculoskeletal:        General: No tenderness. Normal range of motion.     Cervical back: Normal range of motion and neck supple.  Skin:    General: Skin is warm and dry.  Neurological:     Mental Status: She is alert and oriented to person, place, and time.     Cranial Nerves: No cranial nerve deficit.     Deep Tendon Reflexes: Reflexes are normal and symmetric.  Psychiatric:        Behavior: Behavior normal.        Thought Content: Thought content normal.        Judgment: Judgment normal.      BP 111/75   Pulse 82   Temp (!) 97 F (36.1 C) (Temporal)   Ht 5\' 5"  (1.651 m)   Wt 134 lb (60.8 kg)   LMP 04/07/2022   SpO2 100%   BMI 22.30 kg/m       Assessment & Plan:  Meredith Hall comes in today with chief complaint of Flank Pain   Diagnosis and orders addressed:  1. UTI symptoms - Urinalysis, Complete - Urine Culture - CT Abdomen Pelvis W Contrast; Future - CBC with Differential/Platelet  2. Flank pain - CT Abdomen Pelvis W Contrast; Future - CBC with Differential/Platelet  3. Left lower quadrant abdominal pain - CT Abdomen Pelvis W Contrast; Future - CBC with Differential/Platelet  4. LUQ abdominal pain - CT Abdomen Pelvis W Contrast; Future - CBC with Differential/Platelet   Labs pending Urine negative  Given flank pain and LUQ and LLQ pain will order CT, however, patient states there is a chance she could be pregnant and wants to wait until next week after her menstrual is due. Red flags discussed.  Health Maintenance reviewed Diet and exercise encouraged   Jannifer Rodney, FNP

## 2022-04-29 LAB — CBC WITH DIFFERENTIAL/PLATELET
Basophils Absolute: 0.1 10*3/uL (ref 0.0–0.2)
Basos: 1 %
EOS (ABSOLUTE): 0.1 10*3/uL (ref 0.0–0.4)
Eos: 2 %
Hematocrit: 41.2 % (ref 34.0–46.6)
Hemoglobin: 13.9 g/dL (ref 11.1–15.9)
Immature Grans (Abs): 0 10*3/uL (ref 0.0–0.1)
Immature Granulocytes: 0 %
Lymphocytes Absolute: 2.2 10*3/uL (ref 0.7–3.1)
Lymphs: 31 %
MCH: 29.1 pg (ref 26.6–33.0)
MCHC: 33.7 g/dL (ref 31.5–35.7)
MCV: 86 fL (ref 79–97)
Monocytes Absolute: 0.5 10*3/uL (ref 0.1–0.9)
Monocytes: 7 %
Neutrophils Absolute: 4 10*3/uL (ref 1.4–7.0)
Neutrophils: 59 %
Platelets: 297 10*3/uL (ref 150–450)
RBC: 4.78 x10E6/uL (ref 3.77–5.28)
RDW: 12.1 % (ref 11.7–15.4)
WBC: 6.9 10*3/uL (ref 3.4–10.8)

## 2022-05-01 LAB — URINE CULTURE

## 2022-05-09 ENCOUNTER — Ambulatory Visit (HOSPITAL_BASED_OUTPATIENT_CLINIC_OR_DEPARTMENT_OTHER): Payer: 59

## 2022-05-10 ENCOUNTER — Other Ambulatory Visit: Payer: 59

## 2022-05-31 IMAGING — US US OB < 14 WEEKS - US OB TV
1 series · 15 of 28 positions shown · non-contrast
Comparison: None.

CLINICAL DATA: Pelvic pain and vaginal bleeding x4 days.

EXAM:
OBSTETRIC <14 WK US AND TRANSVAGINAL OB US
TECHNIQUE: Both transabdominal and transvaginal ultrasound examinations were
performed for complete evaluation of the gestation as well as the
maternal uterus, adnexal regions, and pelvic cul-de-sac.
Transvaginal technique was performed to assess early pregnancy.

[Series 1: us ob < 14 weeks - us ob tv · 43 acquisitions, 15 frames shown]
[im 1/43]
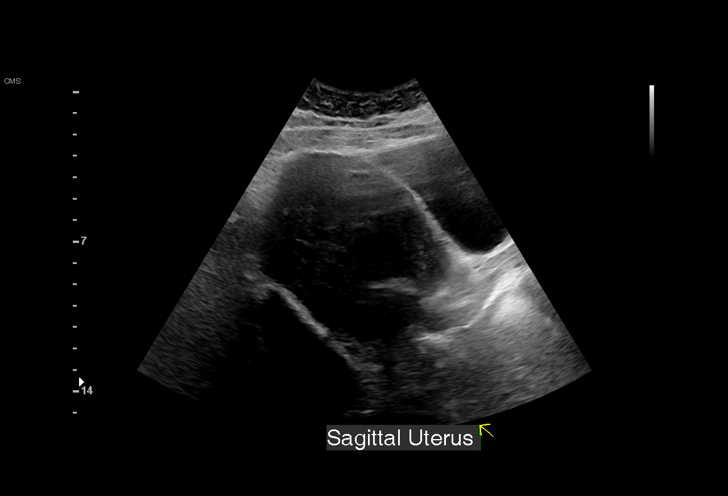
[im 4/43]
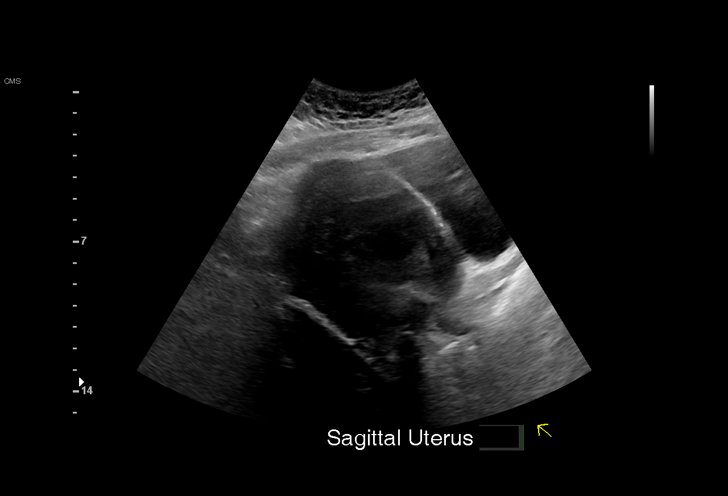
[im 7/43]
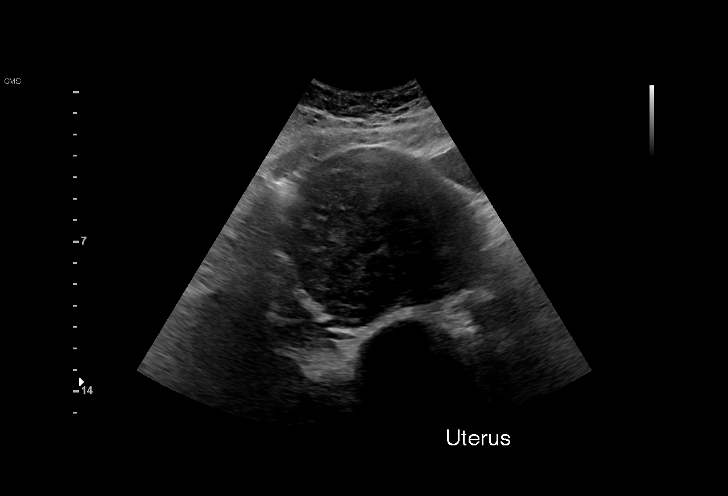
[im 10/43]
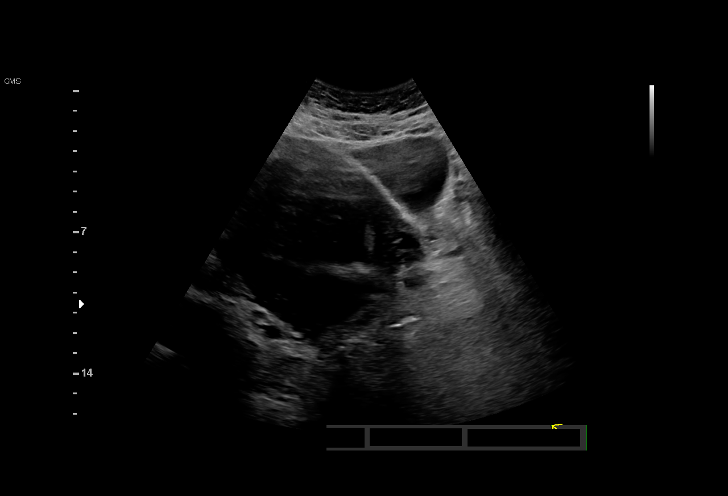
[im 13/43]
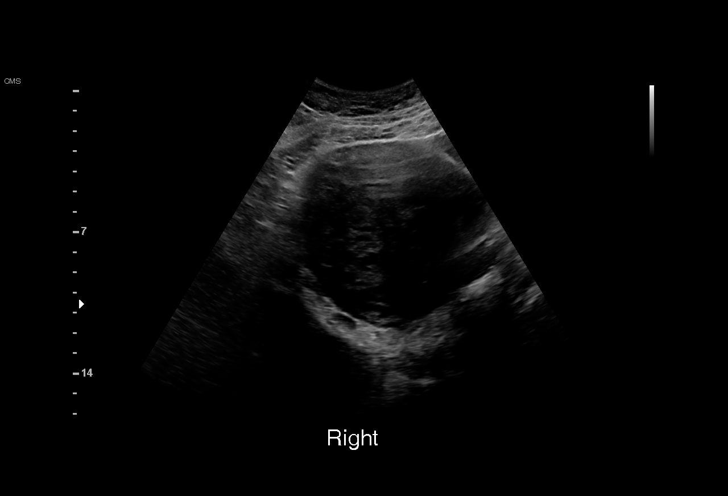
[im 16/43]
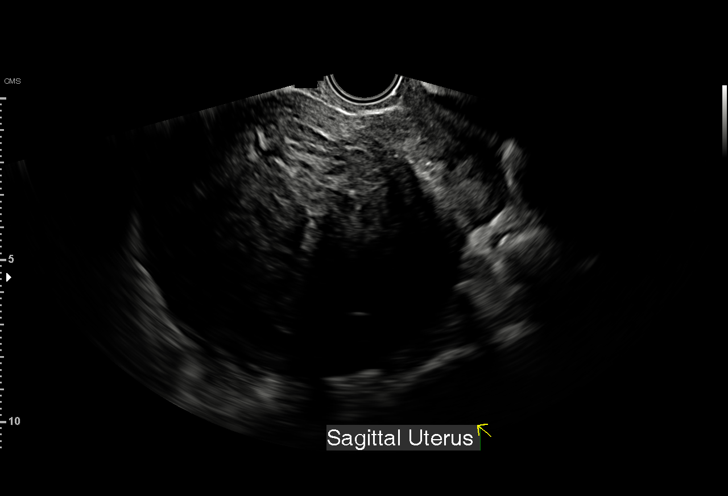
[im 19/43]
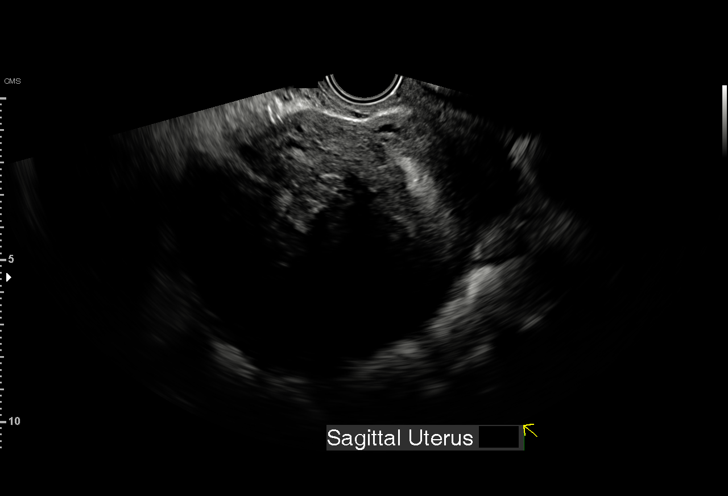
[im 22/43]
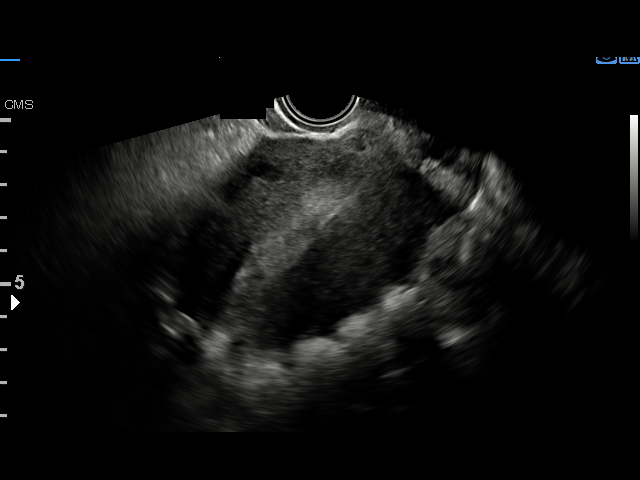
[im 24/43]
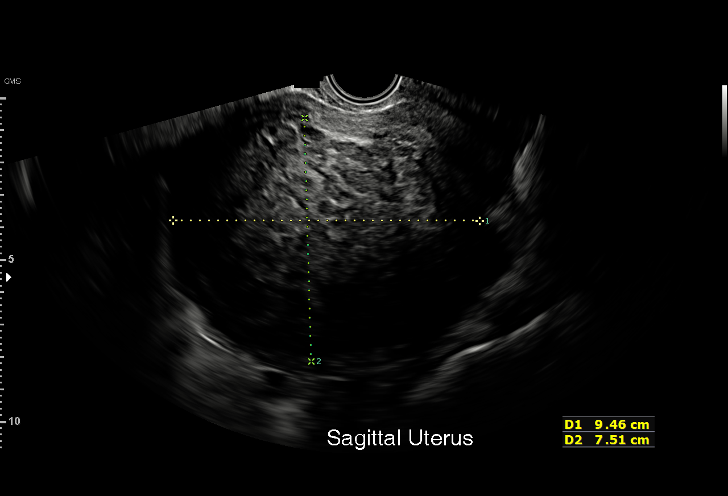
[im 27/43]
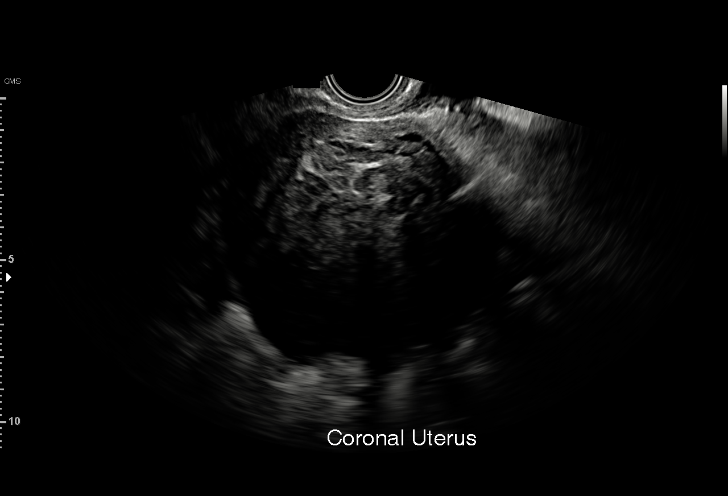
[im 30/43]
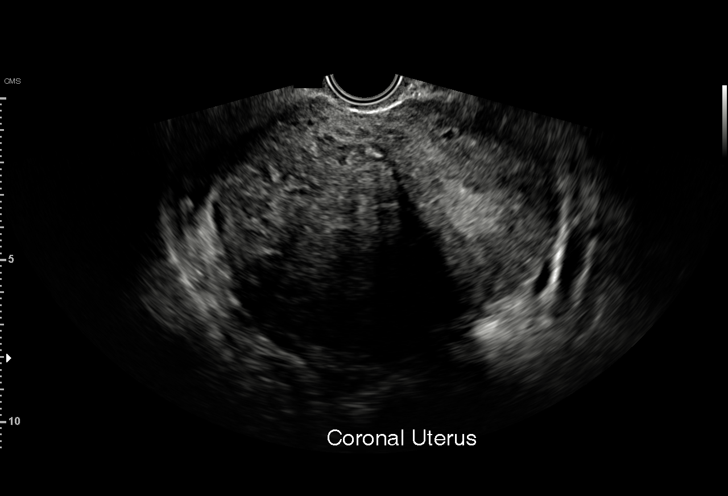
[im 33/43]
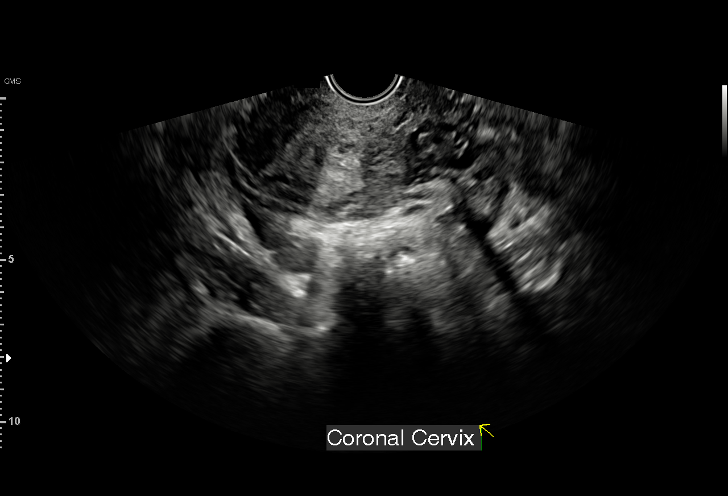
[im 36/43]
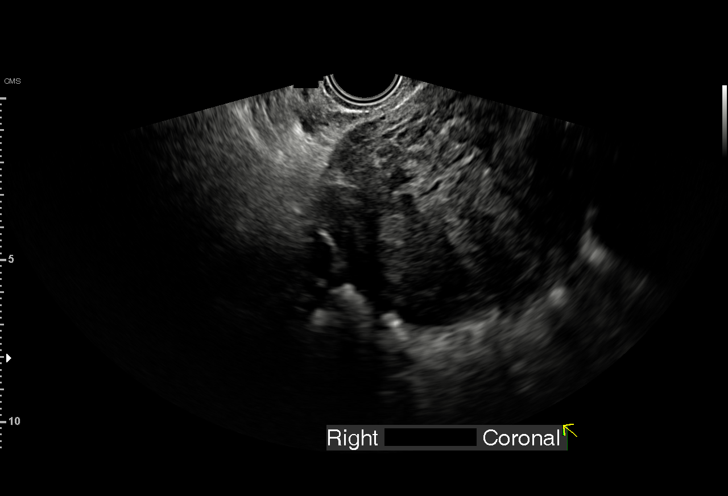
[im 39/43]
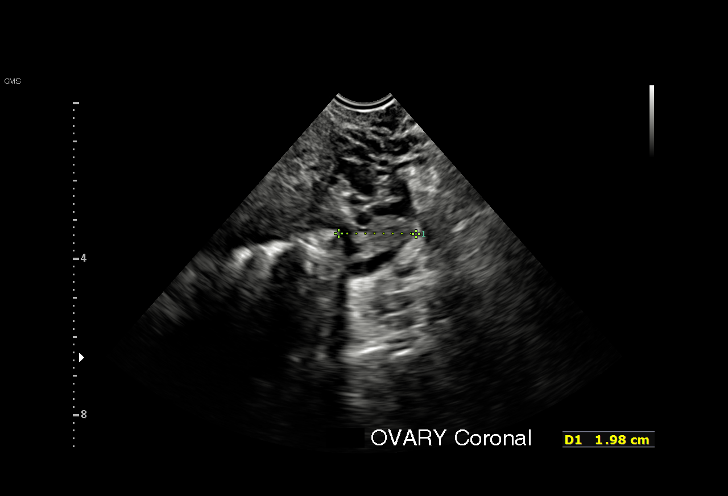
[im 43/43]
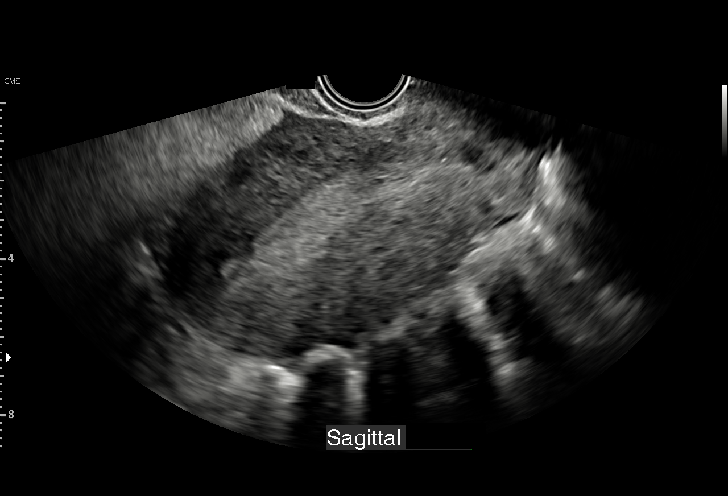

[15 of 28 positions shown; findings below may reference images not displayed]

FINDINGS: Intrauterine gestational sac: None

Yolk sac:  Not Visualized.

Embryo:  Not Visualized.

Cardiac Activity: Not Visualized.

Heart Rate: N/A  bpm

Subchorionic hemorrhage:  None visualized.

Maternal uterus/adnexae: A 9.5 cm x 7.5 cm x 7.3 cm heterogeneous
uterine fibroid is seen on the right, with subsequent mass effect on
the endometrium which is displaced to the left.

The right ovary is not visualized.

The left ovary is visualized and is normal in appearance.

No pelvic free fluid is identified.
IMPRESSION: 1. No evidence of an intrauterine pregnancy.
2. Large heterogeneous uterine fibroid, as described above.

## 2022-09-04 ENCOUNTER — Other Ambulatory Visit: Payer: 59

## 2022-09-04 ENCOUNTER — Other Ambulatory Visit: Payer: Self-pay | Admitting: Family Medicine

## 2022-09-04 DIAGNOSIS — R42 Dizziness and giddiness: Secondary | ICD-10-CM | POA: Diagnosis not present

## 2022-09-04 LAB — BAYER DCA HB A1C WAIVED: HB A1C (BAYER DCA - WAIVED): 5.1 % (ref 4.8–5.6)

## 2022-09-25 ENCOUNTER — Other Ambulatory Visit: Payer: Self-pay

## 2022-09-25 ENCOUNTER — Encounter: Payer: Self-pay | Admitting: Family

## 2022-09-25 ENCOUNTER — Ambulatory Visit (INDEPENDENT_AMBULATORY_CARE_PROVIDER_SITE_OTHER): Payer: 59 | Admitting: Family

## 2022-09-25 ENCOUNTER — Other Ambulatory Visit (HOSPITAL_COMMUNITY): Payer: Self-pay

## 2022-09-25 VITALS — BP 121/83 | HR 69 | Temp 98.0°F | Ht 65.0 in | Wt 132.4 lb

## 2022-09-25 DIAGNOSIS — J209 Acute bronchitis, unspecified: Secondary | ICD-10-CM | POA: Diagnosis not present

## 2022-09-25 MED ORDER — PREDNISONE 10 MG (21) PO TBPK: ORAL_TABLET | ORAL | 0 refills | Status: DC

## 2022-09-25 MED ORDER — BENZONATATE 200 MG PO CAPS: 200.0000 mg | ORAL_CAPSULE | Freq: Three times a day (TID) | ORAL | 1 refills | Status: DC | PRN

## 2022-09-25 MED ORDER — PROMETHAZINE-DM 6.25-15 MG/5ML PO SYRP: 5.0000 mL | ORAL_SOLUTION | Freq: Three times a day (TID) | ORAL | 0 refills | Status: DC | PRN

## 2022-09-25 NOTE — Progress Notes (Signed)
Subjective:    Patient ID: Meredith Hall, female    DOB: Aug 08, 1990, 32 y.o.   MRN: [id number]  Chief Complaint  Patient presents with   Cough    SINCE fIRIDAY WORSE NIGHT NO SLEEP    Pt presents to the office today with cough.  Cough This is a new problem. The current episode started in the past 7 days. The problem has been waxing and waning. The problem occurs every few minutes. The cough is Productive of purulent sputum and productive of sputum. Pertinent negatives include no chills, ear congestion, ear pain, fever, headaches, myalgias, shortness of breath or wheezing. She has tried rest (zyrtec and flonase) for the symptoms. The treatment provided mild relief.      Review of Systems  Constitutional:  Negative for chills and fever.  HENT:  Negative for ear pain.   Respiratory:  Positive for cough. Negative for shortness of breath and wheezing.   Musculoskeletal:  Negative for myalgias.  Neurological:  Negative for headaches.  All other systems reviewed and are negative.      Objective:   Physical Exam Vitals reviewed.  Constitutional:      General: She is not in acute distress.    Appearance: She is well-developed.  HENT:     Head: Normocephalic and atraumatic.     Right Ear: Tympanic membrane normal.     Left Ear: Tympanic membrane normal.     Mouth/Throat:     Pharynx: Posterior oropharyngeal erythema present.  Eyes:     Pupils: Pupils are equal, round, and reactive to light.  Neck:     Thyroid: No thyromegaly.  Cardiovascular:     Rate and Rhythm: Normal rate and regular rhythm.     Heart sounds: Normal heart sounds. No murmur heard. Pulmonary:     Effort: Pulmonary effort is normal. No respiratory distress.     Breath sounds: Normal breath sounds. No wheezing.  Abdominal:     General: Bowel sounds are normal. There is no distension.     Palpations: Abdomen is soft.     Tenderness: There is no abdominal tenderness.  Musculoskeletal:        General:  No tenderness. Normal range of motion.     Cervical back: Normal range of motion and neck supple.  Skin:    General: Skin is warm and dry.  Neurological:     Mental Status: She is alert and oriented to person, place, and time.     Cranial Nerves: No cranial nerve deficit.     Deep Tendon Reflexes: Reflexes are normal and symmetric.  Psychiatric:        Behavior: Behavior normal.        Thought Content: Thought content normal.        Judgment: Judgment normal.       BP 121/83   Pulse 69   Temp 98 F (36.7 C) (Temporal)   Ht 5\' 5"  (1.651 m)   Wt 132 lb 6.4 oz (60.1 kg)   SpO2 99%   BMI 22.03 kg/m      Assessment & Plan:  Meredith Hall comes in today with chief complaint of Cough (SINCE fIRIDAY WORSE NIGHT NO SLEEP )   Diagnosis and orders addressed:  1. Acute bronchitis, unspecified organism - Take meds as prescribed - Use a cool mist humidifier  -Use saline nose sprays frequently -Force fluids -For any cough or congestion  Use plain Mucinex- regular strength or max strength is fine -For fever  or aces or pains- take tylenol or ibuprofen. -Throat lozenges if help -Follow up if symptoms worsen or do not improve  - predniSONE (STERAPRED UNI-PAK 21 TAB) 10 MG (21) TBPK tablet; Use as directed  Dispense: 21 tablet; Refill: 0 - benzonatate (TESSALON) 200 MG capsule; Take 1 capsule (200 mg total) by mouth 3 (three) times daily as needed.  Dispense: 30 capsule; Refill: 1 - promethazine-dextromethorphan (PROMETHAZINE-DM) 6.25-15 MG/5ML syrup; Take 5 mLs by mouth 3 (three) times daily as needed for cough.  Dispense: 118 mL; Refill: 0     Jannifer Rodney, FNP

## 2022-09-25 NOTE — Patient Instructions (Signed)

## 2022-11-07 ENCOUNTER — Other Ambulatory Visit (INDEPENDENT_AMBULATORY_CARE_PROVIDER_SITE_OTHER): Payer: 59 | Admitting: Family Medicine

## 2022-11-07 ENCOUNTER — Telehealth: Payer: Self-pay

## 2022-11-07 DIAGNOSIS — L282 Other prurigo: Secondary | ICD-10-CM

## 2022-11-07 MED ORDER — TRIAMCINOLONE ACETONIDE 0.1 % EX CREA: 1.0000 | TOPICAL_CREAM | Freq: Two times a day (BID) | CUTANEOUS | 0 refills | Status: AC

## 2022-11-07 NOTE — Telephone Encounter (Signed)
Medications sent to [facility].

## 2022-11-07 NOTE — Telephone Encounter (Signed)
PATIENT AWARE

## 2022-11-07 NOTE — Progress Notes (Signed)
Pruritic rash to left and right neck.

## 2022-11-07 NOTE — Telephone Encounter (Signed)
Pt has rash on neck

## 2022-11-08 DIAGNOSIS — L282 Other prurigo: Secondary | ICD-10-CM | POA: Diagnosis not present

## 2022-11-08 MED ORDER — METHYLPREDNISOLONE ACETATE 40 MG/ML IJ SUSP
40.0000 mg | Freq: Once | INTRAMUSCULAR | Status: AC
  Administered 2022-11-08: 60 mg via INTRAMUSCULAR

## 2022-11-08 NOTE — Addendum Note (Signed)
Addended by: Lorelee Cover C on: 11/08/2022 10:32 AM   Modules accepted: Orders

## 2022-12-01 DIAGNOSIS — L308 Other specified dermatitis: Secondary | ICD-10-CM | POA: Diagnosis not present

## 2022-12-01 DIAGNOSIS — L818 Other specified disorders of pigmentation: Secondary | ICD-10-CM | POA: Diagnosis not present

## 2022-12-04 ENCOUNTER — Other Ambulatory Visit: Payer: Self-pay | Admitting: Family Medicine

## 2022-12-04 ENCOUNTER — Other Ambulatory Visit: Payer: 59

## 2022-12-04 DIAGNOSIS — L282 Other prurigo: Secondary | ICD-10-CM | POA: Diagnosis not present

## 2022-12-07 LAB — ALPHA-GAL PANEL
Allergen Lamb IgE: <0.1 kU/L
Beef IgE: <0.1 kU/L
IgE (Immunoglobulin E), Serum: 130 [IU]/mL (ref 6–495)
O215-IgE Alpha-Gal: <0.1 kU/L
Pork IgE: <0.1 kU/L

## 2023-01-13 ENCOUNTER — Other Ambulatory Visit (HOSPITAL_COMMUNITY): Payer: Self-pay

## 2023-08-21 ENCOUNTER — Other Ambulatory Visit: Payer: Self-pay | Admitting: *Deleted

## 2023-08-21 ENCOUNTER — Other Ambulatory Visit

## 2023-08-21 DIAGNOSIS — Z0000 Encounter for general adult medical examination without abnormal findings: Secondary | ICD-10-CM

## 2023-08-21 DIAGNOSIS — E559 Vitamin D deficiency, unspecified: Secondary | ICD-10-CM | POA: Diagnosis not present

## 2023-08-21 LAB — LIPID PANEL

## 2023-08-22 LAB — THYROID PANEL WITH TSH
Free Thyroxine Index: 2.3 (ref 1.2–4.9)
T3 Uptake Ratio: 26 (ref 24–39)
T4, Total: 8.8 ug/dL (ref 4.5–12.0)
TSH: 2.35 u[IU]/mL (ref 0.450–4.500)

## 2023-08-22 LAB — CBC WITH DIFFERENTIAL/PLATELET
Basophils Absolute: 0.1 x10E3/uL (ref 0.0–0.2)
Basos: 1 %
EOS (ABSOLUTE): 0.2 x10E3/uL (ref 0.0–0.4)
Eos: 4 %
Hematocrit: 43.2 % (ref 34.0–46.6)
Hemoglobin: 14.1 g/dL (ref 11.1–15.9)
Immature Grans (Abs): 0 x10E3/uL (ref 0.0–0.1)
Immature Granulocytes: 0 %
Lymphocytes Absolute: 1.7 x10E3/uL (ref 0.7–3.1)
Lymphs: 31 %
MCH: 29 pg (ref 26.6–33.0)
MCHC: 32.6 g/dL (ref 31.5–35.7)
MCV: 89 fL (ref 79–97)
Monocytes Absolute: 0.4 x10E3/uL (ref 0.1–0.9)
Monocytes: 7 %
Neutrophils Absolute: 3.2 x10E3/uL (ref 1.4–7.0)
Neutrophils: 57 %
Platelets: 232 x10E3/uL (ref 150–450)
RBC: 4.86 x10E6/uL (ref 3.77–5.28)
RDW: 12.1 % (ref 11.7–15.4)
WBC: 5.6 x10E3/uL (ref 3.4–10.8)

## 2023-08-22 LAB — CMP14+EGFR
ALT: 12 IU/L (ref 0–32)
AST: 13 IU/L (ref 0–40)
Albumin: 4.6 g/dL (ref 3.9–4.9)
Alkaline Phosphatase: 59 IU/L (ref 44–121)
BUN/Creatinine Ratio: 13 (ref 9–23)
BUN: 8 mg/dL (ref 6–20)
Bilirubin Total: 0.5 mg/dL (ref 0.0–1.2)
CO2: 21 mmol/L (ref 20–29)
Calcium: 9.6 mg/dL (ref 8.7–10.2)
Chloride: 102 mmol/L (ref 96–106)
Creatinine, Ser: 0.64 mg/dL (ref 0.57–1.00)
Globulin, Total: 2.2 g/dL (ref 1.5–4.5)
Glucose: 87 mg/dL (ref 70–99)
Potassium: 4.6 mmol/L (ref 3.5–5.2)
Sodium: 138 mmol/L (ref 134–144)
Total Protein: 6.8 g/dL (ref 6.0–8.5)
eGFR: 120 mL/min/1.73 (ref >59)

## 2023-08-22 LAB — LIPID PANEL
Cholesterol, Total: 134 mg/dL (ref 100–199)
HDL: 41 mg/dL (ref >39)
LDL CALC COMMENT:: 3.3 ratio (ref 0.0–4.4)
LDL Chol Calc (NIH): 79 mg/dL (ref 0–99)
Triglycerides: 66 mg/dL (ref 0–149)
VLDL Cholesterol Cal: 14 mg/dL (ref 5–40)

## 2023-08-22 LAB — VITAMIN D 25 HYDROXY (VIT D DEFICIENCY, FRACTURES): Vit D, 25-Hydroxy: 25.7 ng/mL — AB (ref 30.0–100.0)

## 2023-08-23 ENCOUNTER — Other Ambulatory Visit: Payer: Self-pay

## 2023-08-23 ENCOUNTER — Other Ambulatory Visit: Payer: Self-pay | Admitting: Family

## 2023-08-23 ENCOUNTER — Ambulatory Visit: Payer: Self-pay | Admitting: Family

## 2023-08-23 MED ORDER — VITAMIN D (ERGOCALCIFEROL) 1.25 MG (50000 UNIT) PO CAPS
50000.0000 [IU] | ORAL_CAPSULE | ORAL | 3 refills | Status: AC
  Filled 2023-08-23: qty 12, 84d supply, fill #0

## 2023-08-24 ENCOUNTER — Encounter: Payer: Self-pay | Admitting: Family

## 2023-08-24 ENCOUNTER — Ambulatory Visit: Admitting: Family

## 2023-08-24 ENCOUNTER — Other Ambulatory Visit (HOSPITAL_COMMUNITY): Payer: Self-pay

## 2023-08-24 VITALS — BP 118/74 | HR 65 | Temp 98.7°F | Ht 65.0 in | Wt 128.0 lb

## 2023-08-24 DIAGNOSIS — Z0001 Encounter for general adult medical examination with abnormal findings: Secondary | ICD-10-CM | POA: Diagnosis not present

## 2023-08-24 DIAGNOSIS — E559 Vitamin D deficiency, unspecified: Secondary | ICD-10-CM | POA: Diagnosis not present

## 2023-08-24 DIAGNOSIS — Z0000 Encounter for general adult medical examination without abnormal findings: Secondary | ICD-10-CM

## 2023-08-24 NOTE — Patient Instructions (Signed)

## 2023-08-24 NOTE — Progress Notes (Signed)
 Subjective:    Patient ID: Meredith Hall, female    DOB: 1990/12/01, 33 y.o.   MRN: [id number]  Chief Complaint  Patient presents with   Annual Exam    HPI Pt presents to the office today for CPE without pap. She is followed by GYN.  She is currently taking zyrtec  10 mg daily.   Pt denies any headache, palpitations, SOB, or edema at this time.   Had labs drawn prior to visit.   Review of Systems  All other systems reviewed and are negative.  Family History  Problem Relation Age of Onset   Diabetes Father    Thyroid  disease Mother    Social History   Socioeconomic History   Marital status: Significant Other    Spouse name: Not on file   Number of children: Not on file   Years of education: Not on file   Highest education level: Associate degree: academic program  Occupational History   Not on file  Tobacco Use   Smoking status: Never   Smokeless tobacco: Never  Vaping Use   Vaping status: Never Used  Substance and Sexual Activity   Alcohol use: No   Drug use: No   Sexual activity: Yes    Birth control/protection: None  Other Topics Concern   Not on file  Social History Narrative   Not on file   Social Drivers of Health   Financial Resource Strain: Low Risk  (08/20/2023)   Overall Financial Resource Strain (CARDIA)    Difficulty of Paying Living Expenses: Not hard at all  Food Insecurity: No Food Insecurity (08/20/2023)   Hunger Vital Sign    Worried About Running Out of Food in the Last Year: Never true    Ran Out of Food in the Last Year: Never true  Transportation Needs: No Transportation Needs (08/20/2023)   PRAPARE - Administrator, Civil Service (Medical): No    Lack of Transportation (Non-Medical): No  Physical Activity: Sufficiently Active (08/20/2023)   Exercise Vital Sign    Days of Exercise per Week: 5 days    Minutes of Exercise per Session: 150+ min  Stress: No Stress Concern Present (08/20/2023)   [employer] of  Occupational Health - Occupational Stress Questionnaire    Feeling of Stress: Only a little  Social Connections: Moderately Integrated (08/20/2023)   Social Connection and Isolation Panel    Frequency of Communication with Friends and Family: More than three times a week    Frequency of Social Gatherings with Friends and Family: Once a week    Attends Religious Services: More than 4 times per year    Active Member of [employer] or Organizations: No    Attends Engineer, structural: Not on file    Marital Status: Married       Objective:   Physical Exam Vitals reviewed.  Constitutional:      General: She is not in acute distress.    Appearance: She is well-developed.  HENT:     Head: Normocephalic and atraumatic.     Right Ear: Tympanic membrane normal.     Left Ear: Tympanic membrane normal.  Eyes:     Pupils: Pupils are equal, round, and reactive to light.  Neck:     Thyroid : No thyromegaly.  Cardiovascular:     Rate and Rhythm: Regular rhythm.     Heart sounds: Normal heart sounds. No murmur heard. Pulmonary:     Effort: Pulmonary effort is normal. No respiratory  distress.     Breath sounds: Normal breath sounds. No wheezing.  Abdominal:     General: Bowel sounds are normal. There is no distension.     Palpations: Abdomen is soft.     Tenderness: There is no abdominal tenderness.  Musculoskeletal:        General: No tenderness. Normal range of motion.     Cervical back: Normal range of motion and neck supple.  Skin:    General: Skin is warm and dry.  Neurological:     Mental Status: She is alert and oriented to person, place, and time.     Cranial Nerves: No cranial nerve deficit.     Deep Tendon Reflexes: Reflexes are normal and symmetric.  Psychiatric:        Behavior: Behavior normal.        Thought Content: Thought content normal.        Judgment: Judgment normal.     BP 118/74   Pulse 65   Temp 98.7 F (37.1 C)   Ht 5' 5 (1.651 m)   Wt 128 lb (58.1  kg)   SpO2 98%   BMI 21.30 kg/m        Assessment & Plan:  Keisi A Massoud comes in today with chief complaint of Annual Exam   Diagnosis and orders addressed:  1. Annual physical exam (Primary)  2. Vitamin D  deficiency   Labs discussed Continue current medications   Health Maintenance reviewed Diet and exercise encouraged  Follow up plan: 1 year    Bari Learn, FNP

## 2023-12-20 DIAGNOSIS — N911 Secondary amenorrhea: Secondary | ICD-10-CM | POA: Diagnosis not present

## 2023-12-31 DIAGNOSIS — Z3401 Encounter for supervision of normal first pregnancy, first trimester: Secondary | ICD-10-CM | POA: Diagnosis not present

## 2023-12-31 DIAGNOSIS — Z1321 Encounter for screening for nutritional disorder: Secondary | ICD-10-CM | POA: Diagnosis not present

## 2023-12-31 DIAGNOSIS — Z3481 Encounter for supervision of other normal pregnancy, first trimester: Secondary | ICD-10-CM | POA: Diagnosis not present

## 2023-12-31 DIAGNOSIS — Z3A09 9 weeks gestation of pregnancy: Secondary | ICD-10-CM | POA: Diagnosis not present

## 2023-12-31 DIAGNOSIS — Z3685 Encounter for antenatal screening for Streptococcus B: Secondary | ICD-10-CM | POA: Diagnosis not present
